# Patient Record
Sex: Female | Born: 1960 | Race: Asian | Hispanic: Yes | Marital: Married | Smoking: Never smoker
Health system: Southern US, Community
[De-identification: ages and names within clinical notes are randomized; demographics above are authoritative.]

## PROBLEM LIST (undated history)

## (undated) DIAGNOSIS — E119 Type 2 diabetes mellitus without complications: Secondary | ICD-10-CM

## (undated) HISTORY — DX: Type 2 diabetes mellitus without complications: E11.9

---

## 2000-11-22 ENCOUNTER — Encounter: Payer: Self-pay | Admitting: Emergency Medicine

## 2000-11-22 ENCOUNTER — Emergency Department (HOSPITAL_COMMUNITY): Admission: EM | Admit: 2000-11-22 | Discharge: 2000-11-23 | Payer: Self-pay | Admitting: Emergency Medicine

## 2012-05-20 ENCOUNTER — Ambulatory Visit: Payer: Self-pay

## 2013-05-24 ENCOUNTER — Ambulatory Visit: Payer: Self-pay | Admitting: *Deleted

## 2014-05-25 ENCOUNTER — Ambulatory Visit: Payer: Self-pay | Admitting: Family

## 2015-11-17 ENCOUNTER — Ambulatory Visit (INDEPENDENT_AMBULATORY_CARE_PROVIDER_SITE_OTHER): Payer: PRIVATE HEALTH INSURANCE | Admitting: Physician Assistant

## 2015-11-17 VITALS — BP 106/60 | HR 61 | Temp 97.4°F | Resp 17 | Ht 60.0 in | Wt 141.0 lb

## 2015-11-17 DIAGNOSIS — R739 Hyperglycemia, unspecified: Secondary | ICD-10-CM

## 2015-11-17 DIAGNOSIS — E1165 Type 2 diabetes mellitus with hyperglycemia: Secondary | ICD-10-CM

## 2015-11-17 LAB — COMPLETE METABOLIC PANEL WITH GFR
ALT: 35 U/L — ABNORMAL HIGH (ref 6–29)
AST: 29 U/L (ref 10–35)
Albumin: 4.6 g/dL (ref 3.6–5.1)
Alkaline Phosphatase: 63 U/L (ref 33–130)
BUN: 10 mg/dL (ref 7–25)
CO2: 24 mmol/L (ref 20–31)
Calcium: 9.7 mg/dL (ref 8.6–10.4)
Chloride: 104 mmol/L (ref 98–110)
Creat: 0.63 mg/dL (ref 0.50–1.05)
GFR, Est African American: >89 mL/min (ref >=60)
GFR, Est Non African American: >89 mL/min (ref >=60)
Glucose, Bld: 143 mg/dL — ABNORMAL HIGH (ref 65–99)
Potassium: 4.3 mmol/L (ref 3.5–5.3)
Sodium: 140 mmol/L (ref 135–146)
Total Bilirubin: 0.7 mg/dL (ref 0.2–1.2)
Total Protein: 8.4 g/dL — ABNORMAL HIGH (ref 6.1–8.1)

## 2015-11-17 LAB — POCT GLYCOSYLATED HEMOGLOBIN (HGB A1C): Hemoglobin A1C: 9.8

## 2015-11-17 LAB — MICROALBUMIN, URINE: Microalb, Ur: 1.7 mg/dL

## 2015-11-17 LAB — GLUCOSE, POCT (MANUAL RESULT ENTRY): POC Glucose: 148 mg/dl — AB (ref 70–99)

## 2015-11-17 MED ORDER — BLOOD GLUCOSE MONITOR KIT: PACK | Status: AC

## 2015-11-17 MED ORDER — METFORMIN HCL 500 MG PO TABS: 500.0000 mg | ORAL_TABLET | Freq: Two times a day (BID) | ORAL | Status: DC

## 2015-11-17 NOTE — Progress Notes (Signed)
[facility] [address], [place] [address] [phone]  Date:  11/17/2015   Name:  Miranda Sweeney   DOB:  1961-01-08   MRN:  [id number]  PCP:  No primary care provider on file.    History of Present Illness:  Miranda Sweeney is a 55 y.o. female patient who presents to [facility] for elevated glucose.  She was at her workplace when her glucose was checked. He was found to be elevated. She was encouraged to go to a medical facility for evaluation of this. She states that she has periods of fatigue, tremulousness, and urinary frequency. She will have blurry vision very rarely. She denies chest pain, palpitations, or shortness of breath. She relates vegetables and meats, but there is a lot.  No numbness or tingling.   There are no active problems to display for this patient.   No past medical history on file.  No past surgical history on file.  Social History  Substance Use Topics  . Smoking status: Never Smoker   . Smokeless tobacco: None  . Alcohol Use: None    No family history on file.  Allergies  Allergen Reactions  . Aspirin Itching    Medication list has been reviewed and updated.  No current outpatient prescriptions on file prior to visit.   No current facility-administered medications on file prior to visit.    ROS ROS otherwise unremarkable unless listed above.   Physical Examination: BP 106/60 mmHg  Pulse 61  Temp(Src) 97.4 F (36.3 C) (Oral)  Resp 17  Ht 5' (1.524 m)  Wt 141 lb (63.957 kg)  BMI 27.54 kg/m2  SpO2 98% Ideal Body Weight: Weight in (lb) to have BMI = 25: 127.7  Physical Exam  Constitutional: She is oriented to person, place, and time. She appears well-developed and well-nourished. No distress.  HENT:  Head: Normocephalic and atraumatic.  Right Ear: External ear normal.  Left Ear: External ear normal.  Eyes: Conjunctivae and EOM are normal. Pupils are equal, round, and reactive to light.  Cardiovascular: Normal rate.    Pulmonary/Chest: Effort normal. No respiratory distress.  Feet:  Right Foot:  Protective Sensation: 6 sites tested.6 sites sensed. Skin Integrity: Negative for ulcer or skin breakdown.  Left Foot:  Protective Sensation: 6 sites tested. 6 sites sensed. Skin Integrity: Negative for ulcer or skin breakdown.  Neurological: She is alert and oriented to person, place, and time.  Skin: She is not diaphoretic.  Psychiatric: She has a normal mood and affect. Her behavior is normal.   Results for orders placed or performed in visit on 11/17/15  Microalbumin, urine  Result Value Ref Range   Microalb, Ur 1.7 Not estab mg/dL  COMPLETE METABOLIC PANEL WITH GFR  Result Value Ref Range   Sodium 140 135 - 146 mmol/L   Potassium 4.3 3.5 - 5.3 mmol/L   Chloride 104 98 - 110 mmol/L   CO2 24 20 - 31 mmol/L   Glucose, Bld 143 (H) 65 - 99 mg/dL   BUN 10 7 - 25 mg/dL   Creat 4.71 2.52 - 7.12 mg/dL   Total Bilirubin 0.7 0.2 - 1.2 mg/dL   Alkaline Phosphatase 63 33 - 130 U/L   AST 29 10 - 35 U/L   ALT 35 (H) 6 - 29 U/L   Total Protein 8.4 (H) 6.1 - 8.1 g/dL   Albumin 4.6 3.6 - 5.1 g/dL   Calcium 9.7 8.6 - 92.9 mg/dL   GFR, Est African  American >89 >=60 mL/min   GFR, Est Non African American >89 >=60 mL/min  POCT glucose (manual entry)  Result Value Ref Range   POC Glucose 148 (A) 70 - 99 mg/dl  POCT glycosylated hemoglobin (Hb A1C)  Result Value Ref Range   Hemoglobin A1C 9.8      Assessment and Plan: Miranda Sweeney is a 55 y.o. female who is here today for cc of elevated blood glucose.    advised diabetic diet. Glucose monitoring advised.  Start with metformin twice per day.  Recheck in 3 weeks.  rtc if alarming sxs occur. Type 2 diabetes mellitus with hyperglycemia, without long-term current use of insulin ([place]) - Plan: metFORMIN (GLUCOPHAGE) 500 MG tablet, blood glucose meter kit and supplies KIT  Blood glucose elevated - Plan: POCT glucose (manual entry), POCT glycosylated hemoglobin (Hb  A1C), Microalbumin, urine, COMPLETE METABOLIC PANEL WITH GFR, metFORMIN (GLUCOPHAGE) 500 MG tablet    Ivar Drape, PA-C [facility] 11/17/2015 11:00 AM

## 2015-11-17 NOTE — Patient Instructions (Addendum)
IF you received an x-ray today, you will receive an invoice from [facility]. Please contact [facility] at [phone] with questions or concerns regarding your invoice.   IF you received labwork today, you will receive an invoice from [employer]. Please contact [facility] at [phone] with questions or concerns regarding your invoice.   Our billing staff will not be able to assist you with questions regarding bills from these companies.  You will be contacted with the lab results as soon as they are available. The fastest way to get your results is to activate your My Chart account. Instructions are located on the last page of this paperwork. If you have not heard from [place] regarding the results in 2 weeks, please contact this office.     We recommend that you schedule a mammogram for breast cancer screening. Typically, you do not need a referral to do this. Please contact a local imaging center to schedule your mammogram.  [facility] - [phone]  *ask for the Radiology Department [facility]) - [phone] or [phone]  [facility] - [phone] [facility] - [phone] [facility] - [phone]  *ask for the Radiology Department [facility] - [phone]  *ask for the Radiology Department [facility] - [phone]  *ask for the Mammography Department [facility] - [phone]  Please schedule a visit to the eye doctor.  Please alert them that you have diabetes. I would like you to continue to monitor your food.  Avoid the rices, breads and pastas.  Eating lean meats and fresh vegetables (more green) daily.   Please return in 3 months for recheck.  We will recheck your glucose at that time as well as your a1c.   Try to exercise with 30 minutes of aerobic (heart pumping ) activity 4 times per week.  Type 2  Diabetes Mellitus, Adult Type 2 diabetes mellitus, often simply referred to as type 2 diabetes, is a long-lasting (chronic) disease. In type 2 diabetes, the pancreas does not make enough insulin (a hormone), the cells are less responsive to the insulin that is made (insulin resistance), or both. Normally, insulin moves sugars from food into the tissue cells. The tissue cells use the sugars for energy. The lack of insulin or the lack of normal response to insulin causes excess sugars to build up in the blood instead of going into the tissue cells. As a result, high blood sugar (hyperglycemia) develops. The effect of high sugar (glucose) levels can cause many complications. Type 2 diabetes was also previously called adult-onset diabetes, but it can occur at any age.  RISK FACTORS  A person is predisposed to developing type 2 diabetes if someone in the family has the disease and also has one or more of the following primary risk factors:  Weight gain, or being overweight or obese.  An inactive lifestyle.  A history of consistently eating high-calorie foods. Maintaining a normal weight and regular physical activity can reduce the chance of developing type 2 diabetes. SYMPTOMS  A person with type 2 diabetes may not show symptoms initially. The symptoms of type 2 diabetes appear slowly. The symptoms include:  Increased thirst (polydipsia).  Increased urination (polyuria).  Increased urination during the night (nocturia).  Sudden or unexplained weight changes.  Frequent, recurring infections.  Tiredness (fatigue).  Weakness.  Vision changes, such as blurred vision.  Fruity smell to your  breath.  Abdominal pain.  Nausea or vomiting.  Cuts or bruises which are slow to heal.  Tingling or numbness in the hands or feet.  An open skin wound (ulcer). DIAGNOSIS Type 2 diabetes is frequently not diagnosed until complications of diabetes are present. Type 2 diabetes is diagnosed when  symptoms or complications are present and when blood glucose levels are increased. Your blood glucose level may be checked by one or more of the following blood tests:  A fasting blood glucose test. You will not be allowed to eat for at least 8 hours before a blood sample is taken.  A random blood glucose test. Your blood glucose is checked at any time of the day regardless of when you ate.  A hemoglobin A1c blood glucose test. A hemoglobin A1c test provides information about blood glucose control over the previous 3 months.  An oral glucose tolerance test (OGTT). Your blood glucose is measured after you have not eaten (fasted) for 2 hours and then after you drink a glucose-containing beverage. TREATMENT   You may need to take insulin or diabetes medicine daily to keep blood glucose levels in the desired range.  If you use insulin, you may need to adjust the dosage depending on the carbohydrates that you eat with each meal or snack.  Lifestyle changes are recommended as part of your treatment. These may include:  Following an individualized diet plan developed by a nutritionist or dietitian.  Exercising daily. Your health care providers will set individualized treatment goals for you based on your age, your medicines, how long you have had diabetes, and any other medical conditions you have. Generally, the goal of treatment is to maintain the following blood glucose levels:  Before meals (preprandial): 80-130 mg/dL.  After meals (postprandial): below 180 mg/dL.  A1c: less than 6.5-7%. HOME CARE INSTRUCTIONS   Have your hemoglobin A1c level checked twice a year.  Perform daily blood glucose monitoring as directed by your health care provider.  Monitor urine ketones when you are ill and as directed by your health care provider.  Take your diabetes medicine or insulin as directed by your health care provider to maintain your blood glucose levels in the desired range.  Never run out  of diabetes medicine or insulin. It is needed every day.  If you are using insulin, you may need to adjust the amount of insulin given based on your intake of carbohydrates. Carbohydrates can raise blood glucose levels but need to be included in your diet. Carbohydrates provide vitamins, minerals, and fiber which are an essential part of a healthy diet. Carbohydrates are found in fruits, vegetables, whole grains, dairy products, legumes, and foods containing added sugars.  Eat healthy foods. You should make an appointment to see a registered dietitian to help you create an eating plan that is right for you.  Lose weight if you are overweight.  Carry a medical alert card or wear your medical alert jewelry.  Carry a 15-gram carbohydrate snack with you at all times to treat low blood glucose (hypoglycemia). Some examples of 15-gram carbohydrate snacks include:  Glucose tablets, 3 or 4.  Glucose gel, 15-gram tube.  Raisins, 2 tablespoons (24 grams).  Jelly beans, 6.  Animal crackers, 8.  Regular pop, 4 ounces (120 mL).  Gummy treats, 9.  Recognize hypoglycemia. Hypoglycemia occurs with blood glucose levels of 70 mg/dL and below. The risk for hypoglycemia increases when fasting or skipping meals, during or after intense exercise, and during sleep. Hypoglycemia  symptoms can include:  Tremors or shakes.  Decreased ability to concentrate.  Sweating.  Increased heart rate.  Headache.  Dry mouth.  Hunger.  Irritability.  Anxiety.  Restless sleep.  Altered speech or coordination.  Confusion.  Treat hypoglycemia promptly. If you are alert and able to safely swallow, follow the 15:15 rule:  Take 15-20 grams of rapid-acting glucose or carbohydrate. Rapid-acting options include glucose gel, glucose tablets, or 4 ounces (120 mL) of fruit juice, regular soda, or low-fat milk.  Check your blood glucose level 15 minutes after taking the glucose.  Take 15-20 grams more of  glucose if the repeat blood glucose level is still 70 mg/dL or below.  Eat a meal or snack within 1 hour once blood glucose levels return to normal.  Be alert to feeling very thirsty and urinating more frequently than usual, which are early signs of hyperglycemia. An early awareness of hyperglycemia allows for prompt treatment. Treat hyperglycemia as directed by your health care provider.  Engage in at least 150 minutes of moderate-intensity physical activity a week, spread over at least 3 days of the week or as directed by your health care provider. In addition, you should engage in resistance exercise at least 2 times a week or as directed by your health care provider. Try to spend no more than 90 minutes at one time inactive.  Adjust your medicine and food intake as needed if you start a new exercise or sport.  Follow your sick-day plan anytime you are unable to eat or drink as usual.  Do not use any tobacco products including cigarettes, chewing tobacco, or electronic cigarettes. If you need help quitting, ask your health care provider.  Limit alcohol intake to no more than 1 drink per day for nonpregnant women and 2 drinks per day for men. You should drink alcohol only when you are also eating food. Talk with your health care provider whether alcohol is safe for you. Tell your health care provider if you drink alcohol several times a week.  Keep all follow-up visits as directed by your health care provider. This is important.  Schedule an eye exam soon after the diagnosis of type 2 diabetes and then annually.  Perform daily skin and foot care. Examine your skin and feet daily for cuts, bruises, redness, nail problems, bleeding, blisters, or sores. A foot exam by a health care provider should be done annually.  Brush your teeth and gums at least twice a day and floss at least once a day. Follow up with your dentist regularly.  Share your diabetes management plan with your workplace or  school.  Keep your immunizations up to date. It is recommended that you receive a flu (influenza) vaccine every year. It is also recommended that you receive a pneumonia (pneumococcal) vaccine. If you are 75 years of age or older and have never received a pneumonia vaccine, this vaccine may be given as a series of two separate shots. Ask your health care provider which additional vaccines may be recommended.  Learn to manage stress.  Obtain ongoing diabetes education and support as needed.  Participate in or seek rehabilitation as needed to maintain or improve independence and quality of life. Request a physical or occupational therapy referral if you are having foot or hand numbness, or difficulties with grooming, dressing, eating, or physical activity. SEEK MEDICAL CARE IF:   You are unable to eat food or drink fluids for more than 6 hours.  You have nausea and vomiting  for more than 6 hours.  Your blood glucose level is over 240 mg/dL.  There is a change in mental status.  You develop an additional serious illness.  You have diarrhea for more than 6 hours.  You have been sick or have had a fever for a couple of days and are not getting better.  You have pain during any physical activity.  SEEK IMMEDIATE MEDICAL CARE IF:  You have difficulty breathing.  You have moderate to large ketone levels.   This information is not intended to replace advice given to you by your health care provider. Make sure you discuss any questions you have with your health care provider.   Document Released: 06/02/2005 Document Revised: 02/21/2015 Document Reviewed: 12/30/2011 Elsevier Interactive Patient Education 2016 [employer]  Diabetes Mellitus and Food It is important for you to manage your blood sugar (glucose) level. Your blood glucose level can be greatly affected by what you eat. Eating healthier foods in the appropriate amounts throughout the day at about the same time each day will  help you control your blood glucose level. It can also help slow or prevent worsening of your diabetes mellitus. Healthy eating may even help you improve the level of your blood pressure and reach or maintain a healthy weight.  General recommendations for healthful eating and cooking habits include:  Eating meals and snacks regularly. Avoid going long periods of time without eating to lose weight.  Eating a diet that consists mainly of plant-based foods, such as fruits, vegetables, nuts, legumes, and whole grains.  Using low-heat cooking methods, such as baking, instead of high-heat cooking methods, such as deep frying. Work with your dietitian to make sure you understand how to use the Nutrition Facts information on food labels. HOW CAN FOOD AFFECT ME? Carbohydrates Carbohydrates affect your blood glucose level more than any other type of food. Your dietitian will help you determine how many carbohydrates to eat at each meal and teach you how to count carbohydrates. Counting carbohydrates is important to keep your blood glucose at a healthy level, especially if you are using insulin or taking certain medicines for diabetes mellitus. Alcohol Alcohol can cause sudden decreases in blood glucose (hypoglycemia), especially if you use insulin or take certain medicines for diabetes mellitus. Hypoglycemia can be a life-threatening condition. Symptoms of hypoglycemia (sleepiness, dizziness, and disorientation) are similar to symptoms of having too much alcohol.  If your health care provider has given you approval to drink alcohol, do so in moderation and use the following guidelines:  Women should not have more than one drink per day, and men should not have more than two drinks per day. One drink is equal to:  12 oz of beer.  5 oz of wine.  1 oz of hard liquor.  Do not drink on an empty stomach.  Keep yourself hydrated. Have water, diet soda, or unsweetened iced tea.  Regular soda, juice, and  other mixers might contain a lot of carbohydrates and should be counted. WHAT FOODS ARE NOT RECOMMENDED? As you make food choices, it is important to remember that all foods are not the same. Some foods have fewer nutrients per serving than other foods, even though they might have the same number of calories or carbohydrates. It is difficult to get your body what it needs when you eat foods with fewer nutrients. Examples of foods that you should avoid that are high in calories and carbohydrates but low in nutrients include:  Trans fats (most  processed foods list trans fats on the Nutrition Facts label).  Regular soda.  Juice.  Candy.  Sweets, such as cake, pie, doughnuts, and cookies.  Fried foods. WHAT FOODS CAN I EAT? Eat nutrient-rich foods, which will nourish your body and keep you healthy. The food you should eat also will depend on several factors, including:  The calories you need.  The medicines you take.  Your weight.  Your blood glucose level.  Your blood pressure level.  Your cholesterol level. You should eat a variety of foods, including:  Protein.  Lean cuts of meat.  Proteins low in saturated fats, such as fish, egg whites, and beans. Avoid processed meats.  Fruits and vegetables.  Fruits and vegetables that may help control blood glucose levels, such as apples, mangoes, and yams.  Dairy products.  Choose fat-free or low-fat dairy products, such as milk, yogurt, and cheese.  Grains, bread, pasta, and rice.  Choose whole grain products, such as multigrain bread, whole oats, and brown rice. These foods may help control blood pressure.  Fats.  Foods containing healthful fats, such as nuts, avocado, olive oil, canola oil, and fish. DOES EVERYONE WITH DIABETES MELLITUS HAVE THE SAME MEAL PLAN? Because every person with diabetes mellitus is different, there is not one meal plan that works for everyone. It is very important that you meet with a dietitian  who will help you create a meal plan that is just right for you.   This information is not intended to replace advice given to you by your health care provider. Make sure you discuss any questions you have with your health care provider.   Document Released: 02/27/2005 Document Revised: 06/23/2014 Document Reviewed: 04/29/2013 Elsevier Interactive Patient Education [employer]

## 2015-11-20 ENCOUNTER — Encounter: Payer: Self-pay | Admitting: Radiology

## 2015-12-20 ENCOUNTER — Other Ambulatory Visit: Payer: Self-pay

## 2015-12-20 MED ORDER — GLUCOSE BLOOD VI STRP: 1.0000 | ORAL_STRIP | Freq: Four times a day (QID) | Status: DC | PRN

## 2015-12-20 NOTE — Telephone Encounter (Signed)
request for True Metrix test strips Ordered through [facility] [place] [address].  [phone]

## 2016-01-04 ENCOUNTER — Other Ambulatory Visit: Payer: Self-pay

## 2016-01-04 MED ORDER — GLUCOSE BLOOD VI STRP: 1.0000 | ORAL_STRIP | Freq: Four times a day (QID) | Status: AC | PRN

## 2016-01-04 MED ORDER — GLUCOSE BLOOD VI STRP
ORAL_STRIP | Status: AC
Start: 2016-01-04 — End: ?

## 2016-01-10 ENCOUNTER — Telehealth: Payer: Self-pay | Admitting: Physician Assistant

## 2016-01-10 NOTE — Telephone Encounter (Signed)
Form in box.  Patient asked to return formwork for release of information to her employer...  Please fax this to where it suggest along with labwork, and note.  Please note that this was completed.

## 2016-01-11 NOTE — Telephone Encounter (Signed)
On MR counter.

## 2016-03-26 ENCOUNTER — Other Ambulatory Visit: Payer: Self-pay | Admitting: Physician Assistant

## 2016-03-26 DIAGNOSIS — Z1231 Encounter for screening mammogram for malignant neoplasm of breast: Secondary | ICD-10-CM

## 2016-03-28 ENCOUNTER — Ambulatory Visit
Admission: RE | Admit: 2016-03-28 | Discharge: 2016-03-28 | Disposition: A | Discharge status: Home / Self Care | Payer: No Typology Code available for payment source | Source: Ambulatory Visit | Attending: Physician Assistant | Admitting: Physician Assistant

## 2016-03-28 DIAGNOSIS — Z1231 Encounter for screening mammogram for malignant neoplasm of breast: Secondary | ICD-10-CM | POA: Diagnosis present

## 2016-05-19 ENCOUNTER — Ambulatory Visit (INDEPENDENT_AMBULATORY_CARE_PROVIDER_SITE_OTHER): Payer: PRIVATE HEALTH INSURANCE | Admitting: Family Medicine

## 2016-05-19 VITALS — BP 118/68 | HR 51 | Temp 97.8°F | Resp 16 | Ht 60.0 in | Wt 129.4 lb

## 2016-05-19 DIAGNOSIS — E1165 Type 2 diabetes mellitus with hyperglycemia: Secondary | ICD-10-CM | POA: Diagnosis not present

## 2016-05-19 DIAGNOSIS — R739 Hyperglycemia, unspecified: Secondary | ICD-10-CM | POA: Diagnosis not present

## 2016-05-19 DIAGNOSIS — Z23 Encounter for immunization: Secondary | ICD-10-CM | POA: Diagnosis not present

## 2016-05-19 DIAGNOSIS — Z13220 Encounter for screening for lipoid disorders: Secondary | ICD-10-CM | POA: Diagnosis not present

## 2016-05-19 LAB — POCT GLYCOSYLATED HEMOGLOBIN (HGB A1C): Hemoglobin A1C: 6.1

## 2016-05-19 MED ORDER — METFORMIN HCL 500 MG PO TABS: 500.0000 mg | ORAL_TABLET | Freq: Two times a day (BID) | ORAL | 5 refills | Status: DC

## 2016-05-19 NOTE — Patient Instructions (Addendum)
POCT glycosylated hemoglobin (Hb A1C)  Order: [id number]  Status:  Final result Visible to patient:  No (Not Released) Next appt:  None Dx:  Type 2 diabetes mellitus with hypergl...   10:16  Hemoglobin A1C 6.1     Specimen Collected: 05/19/16 10:16 Last Resulted: 05/19/16 10:16            IF you received an x-ray today, you will receive an invoice from [facility]. Please contact [facility] at [phone] with questions or concerns regarding your invoice.   IF you received labwork today, you will receive an invoice from [employer]. Please contact [facility] at [phone] with questions or concerns regarding your invoice.   Our billing staff will not be able to assist you with questions regarding bills from these companies.  You will be contacted with the lab results as soon as they are available. The fastest way to get your results is to activate your My Chart account. Instructions are located on the last page of this paperwork. If you have not heard from [place] regarding the results in 2 weeks, please contact this office.      Diabetes Mellitus and Standards of Medical Care Managing diabetes (diabetes mellitus) can be complicated. Your diabetes treatment may be managed by a team of health care providers, including:  A diet and nutrition specialist (registered dietitian).  A nurse.  A certified diabetes educator (CDE).  A diabetes specialist (endocrinologist).  An eye doctor.  A primary care provider.  A dentist. Your health care providers follow a schedule in order to help you get the best quality of care. The following schedule is a general guideline for your diabetes management plan. Your health care providers may also give you more specific instructions. HbA1c ( hemoglobin A1c) test This test provides information about blood sugar (glucose) control over the previous 2-3 months. It is used to check whether your diabetes  management plan needs to be adjusted.  If you are meeting your treatment goals, this test is done at least 2 times a year.  If you are not meeting treatment goals or if your treatment goals have changed, this test is done 4 times a year. Blood pressure test  This test is done at every routine medical visit. For most people, the goal is less than 140/90. In some cases, your goal blood pressure may be 130/80 or less. Ask your health care provider what your goal blood pressure should be. Dental and eye exams  Visit your dentist two times a year.  If you have type 1 diabetes, get an eye exam 3-5 years after you are diagnosed, and then once a year after your first exam.  If you were diagnosed with type 1 diabetes as a child, get an eye exam when you are age 60 or older and have had diabetes for 3-5 years. After the first exam, you should get an eye exam once a year.  If you have type 2 diabetes, have an eye exam as soon as you are diagnosed, and then once a year after your first exam. Foot care exam  Visual foot exams are done at every routine medical visit. The exams check for cuts, bruises, redness, blisters, sores, or other problems with the feet.  A complete foot exam is done by your health care provider once a year. This exam includes an inspection of the structure and skin of your feet, and a check of the pulses and sensation in your feet.  Type 1  diabetes: Get your first exam 3-5 years after diagnosis.  Type 2 diabetes: Get your first exam as soon as you are diagnosed.  Check your feet every day for cuts, bruises, redness, blisters, or sores. If you have any of these or other problems that are not healing, contact your health care provider. Kidney function test ( urine microalbumin)  This test is done once a year.  Type 1 diabetes: Get your first test 5 years after diagnosis.  Type 2 diabetes: Get your first test as soon as you are diagnosed.  If you have chronic kidney disease  (CKD), get a serum creatinine and estimated glomerular filtration rate (eGFR) test once a year. Lipid profile (cholesterol, HDL, LDL, triglycerides)  This test should be done when you are diagnosed with diabetes, and every 5 years after the first test. If you are on medicines to lower your cholesterol, you may need to get this test done every year.  The goal for LDL is less than 100 mg/dL (5.5 mmol/L). If you are at high risk, the goal is less than 70 mg/dL (3.9 mmol/L).  The goal for HDL is 40 mg/dL (2.2 mmol/L) for men and 50 mg/dL(2.8 mmol/L) for women. An HDL cholesterol of 60 mg/dL (3.3 mmol/L) or higher gives some protection against heart disease.  The goal for triglycerides is less than 150 mg/dL (8.3 mmol/L). Immunizations  The yearly flu (influenza) vaccine is recommended for everyone 6 months or older who has diabetes.  The pneumonia (pneumococcal) vaccine is recommended for everyone 2 years or older who has diabetes. If you are 69 or older, you may get the pneumonia vaccine as a series of two separate shots.  The hepatitis B vaccine is recommended for adults shortly after they have been diagnosed with diabetes.  The Tdap (tetanus, diphtheria, and pertussis) vaccine should be given:  According to normal childhood vaccination schedules, for children.  Every 10 years, for adults who have diabetes.  The shingles vaccine is recommended for people who have had chicken pox and are 50 years or older. Mental and emotional health  Screening for symptoms of eating disorders, anxiety, and depression is recommended at the time of diagnosis and afterward as needed. If your screening shows that you have symptoms (you have a positive screening result), you may need further evaluation and be referred to a mental health care provider. Diabetes self-management education  Education about how to manage your diabetes is recommended at diagnosis and ongoing as needed. Treatment plan  Your  treatment plan will be reviewed at every medical visit. Summary  Managing diabetes (diabetes mellitus) can be complicated. Your diabetes treatment may be managed by a team of health care providers.  Your health care providers follow a schedule in order to help you get the best quality of care.  Standards of care including having regular physical exams, blood tests, blood pressure monitoring, immunizations, screening tests, and education about how to manage your diabetes.  Your health care providers may also give you more specific instructions based on your individual health. This information is not intended to replace advice given to you by your health care provider. Make sure you discuss any questions you have with your health care provider. Document Released: 03/30/2009 Document Revised: 02/29/2016 Document Reviewed: 02/29/2016 Elsevier Interactive Patient Education  2017 [employer].

## 2016-05-19 NOTE — Progress Notes (Signed)
Chief Complaint  Patient presents with  . Follow-up    diabetes check  . Medication Refill    metformin    HPI  Diabetes Mellitus: Patient presents for follow up of diabetes. Symptoms: polyuria. Symptoms have gradually improved. Patient denies hyperglycemia, hypoglycemia , increase appetite, nausea and paresthesia of the feet.  Evaluation to date has been included: hemoglobin A1C.  Home sugars: BGs consistently in an acceptable range. Treatment to date: Started metformin which has been effective.  She will need refill of her metformin Denies side effects of the metformin  Lab Results  Component Value Date   HGBA1C 9.8 11/17/2015   Wt Readings from Last 3 Encounters:  05/19/16 129 lb 6.4 oz (58.7 kg)  11/17/15 141 lb (64 kg)    Past Medical History:  Diagnosis Date  . Diabetes mellitus without complication [facility])     Current Outpatient Prescriptions  Medication Sig Dispense Refill  . blood glucose meter kit and supplies KIT Dispense based on patient and insurance preference. Use up to four times daily as directed. (FOR ICD-9 250.00, 250.01). 1 each 0  . glucose blood test strip Use as instructed 100 each 12  . glucose blood test strip 1 each by Other route 4 (four) times daily as needed. Test glucose up to 4 (four) times per day. Dx: E11.65 400 each 3  . metFORMIN (GLUCOPHAGE) 500 MG tablet Take 1 tablet (500 mg total) by mouth 2 (two) times daily with a meal. 60 tablet 5   No current facility-administered medications for this visit.     Allergies:  Allergies  Allergen Reactions  . Aspirin Itching    No past surgical history on file.  Social History   Social History  . Marital status: Married    Spouse name: N/A  . Number of children: N/A  . Years of education: N/A   Social History Main Topics  . Smoking status: Never Smoker  . Smokeless tobacco: None  . Alcohol use None  . Drug use: Unknown  . Sexual activity: Not Asked   Other Topics Concern  . None    Social History Narrative  . None    Review of Systems  Constitutional: Positive for weight loss. Negative for chills, diaphoresis, fever and malaise/fatigue.  HENT: Negative for congestion and sinus pain.   Eyes: Negative for blurred vision and double vision.  Respiratory: Negative for cough, shortness of breath and wheezing.   Cardiovascular: Negative for chest pain and palpitations.  Gastrointestinal: Negative for abdominal pain, diarrhea, heartburn, nausea and vomiting.  Genitourinary: Positive for frequency. Negative for dysuria and urgency.  Skin: Negative for itching and rash.  Neurological: Negative for dizziness, tingling, weakness and headaches.    Objective: Vitals:   05/19/16 0918  BP: 118/68  Pulse: (!) 51  Resp: 16  Temp: 97.8 F (36.6 C)  TempSrc: Oral  SpO2: 100%  Weight: 129 lb 6.4 oz (58.7 kg)  Height: 5' (1.524 m)    Physical Exam  Constitutional: She is oriented to person, place, and time. She appears well-developed and well-nourished.  HENT:  Head: Normocephalic and atraumatic.  Eyes: Conjunctivae and EOM are normal.  Cardiovascular: Normal rate, regular rhythm and normal heart sounds.   No murmur heard. Pulmonary/Chest: Effort normal and breath sounds normal. No respiratory distress. She has no wheezes.  Neurological: She is oriented to person, place, and time.  Skin: Skin is warm. Capillary refill takes less than 2 seconds. No rash noted.  Psychiatric: She has a normal  mood and affect. Her behavior is normal. Judgment and thought content normal.    Assessment and Plan Adelynn was seen today for follow-up and medication refill.  Diagnoses and all orders for this visit:  Type 2 diabetes mellitus with hyperglycemia, without long-term current use of insulin (HCC) -     POCT glycosylated hemoglobin (Hb A1C) a1c 6.1  Patient showed improvement with weight loss and Metformin She is doing well on the metformin 500 bid but advised her to decrease to  once daily and monitor her sugars Will refill metformin today Continue ADA diet and exercise Will recheck cmp since there was a mild elevation of the AST with last blood draw  Screening, lipid Will also screen for dyslipidemia in the diabetic patient  A total of 30 minutes were spent face-to-face with the patient during this encounter and over half of that time was spent on counseling and coordination of care.   Follow up in 3 months for diabetes  Laquitha Heslin A Nolon Rod

## 2016-05-20 LAB — COMPREHENSIVE METABOLIC PANEL
A/G RATIO: 1.3 (ref 1.2–2.2)
ALT: 20 IU/L (ref 0–32)
AST: 13 IU/L (ref 0–40)
Albumin: 4.3 g/dL (ref 3.5–5.5)
Alkaline Phosphatase: 48 IU/L (ref 39–117)
BUN/Creatinine Ratio: 16 (ref 9–23)
BUN: 12 mg/dL (ref 6–24)
Bilirubin Total: 0.3 mg/dL (ref 0.0–1.2)
CALCIUM: 9.6 mg/dL (ref 8.7–10.2)
CO2: 27 mmol/L (ref 18–29)
CREATININE: 0.74 mg/dL (ref 0.57–1.00)
Chloride: 102 mmol/L (ref 96–106)
GFR, EST AFRICAN AMERICAN: 104 mL/min/{1.73_m2} (ref >59)
GFR, EST NON AFRICAN AMERICAN: 90 mL/min/{1.73_m2} (ref >59)
Globulin, Total: 3.4 g/dL (ref 1.5–4.5)
Glucose: 94 mg/dL (ref 65–99)
POTASSIUM: 4.9 mmol/L (ref 3.5–5.2)
Sodium: 141 mmol/L (ref 134–144)
TOTAL PROTEIN: 7.7 g/dL (ref 6.0–8.5)

## 2016-05-20 LAB — MICROALBUMIN, URINE

## 2016-05-20 LAB — LIPID PANEL
CHOL/HDL RATIO: 3.4 ratio (ref 0.0–4.4)
Cholesterol, Total: 182 mg/dL (ref 100–199)
HDL: 53 mg/dL (ref >39)
LDL CALC: 104 mg/dL — AB (ref 0–99)
Triglycerides: 126 mg/dL (ref 0–149)
VLDL CHOLESTEROL CAL: 25 mg/dL (ref 5–40)

## 2016-06-11 ENCOUNTER — Encounter: Payer: Self-pay | Admitting: Family Medicine

## 2016-06-27 ENCOUNTER — Ambulatory Visit (INDEPENDENT_AMBULATORY_CARE_PROVIDER_SITE_OTHER): Payer: PRIVATE HEALTH INSURANCE | Admitting: Emergency Medicine

## 2016-06-27 VITALS — BP 122/72 | HR 63 | Temp 97.7°F | Resp 17 | Ht 60.5 in | Wt 129.0 lb

## 2016-06-27 DIAGNOSIS — M7552 Bursitis of left shoulder: Secondary | ICD-10-CM | POA: Diagnosis not present

## 2016-06-27 DIAGNOSIS — M25512 Pain in left shoulder: Secondary | ICD-10-CM | POA: Diagnosis not present

## 2016-06-27 MED ORDER — PREDNISONE 20 MG PO TABS: 40.0000 mg | ORAL_TABLET | Freq: Every day | ORAL | 0 refills | Status: AC

## 2016-06-27 MED ORDER — TRAMADOL HCL 50 MG PO TABS: 50.0000 mg | ORAL_TABLET | Freq: Three times a day (TID) | ORAL | 0 refills | Status: DC | PRN

## 2016-06-27 MED ORDER — TRIAMCINOLONE ACETONIDE 40 MG/ML IJ SUSP
40.0000 mg | Freq: Once | INTRAMUSCULAR | Status: AC
  Administered 2016-06-27: 40 mg via INTRAMUSCULAR

## 2016-06-27 NOTE — Progress Notes (Signed)
Miranda Sweeney 56 y.o.   Chief Complaint  Patient presents with  . Arm Pain    left side     HISTORY OF PRESENT ILLNESS: This is a 56 y.o. female complaining of left shoulder pain with LROM x 2 days.  Arm Pain   The incident occurred 2 days ago. The incident occurred at home. The injury mechanism was repetitive motion (lifts/carries heavy objects at work). The pain is present in the left shoulder. The quality of the pain is described as aching. The pain is at a severity of 7/10. The pain is moderate. The pain has been constant since the incident. Pertinent negatives include no chest pain, muscle weakness, numbness or tingling. The symptoms are aggravated by movement and lifting. She has tried nothing for the symptoms.     Prior to Admission medications   Medication Sig Start Date End Date Taking? Authorizing Provider  blood glucose meter kit and supplies KIT Dispense based on patient and insurance preference. Use up to four times daily as directed. (FOR ICD-9 250.00, 250.01). 11/17/15  Yes Stephanie D English, PA  glucose blood test strip Use as instructed 01/04/16  Yes Stephanie D English, PA  glucose blood test strip 1 each by Other route 4 (four) times daily as needed. Test glucose up to 4 (four) times per day. Dx: E11.65 01/04/16  Yes Collie Siad English, PA  metFORMIN (GLUCOPHAGE) 500 MG tablet Take 1 tablet (500 mg total) by mouth 2 (two) times daily with a meal. 05/19/16  Yes Zoe A Creta Levin, MD  predniSONE (DELTASONE) 20 MG tablet Take 2 tablets (40 mg total) by mouth daily with breakfast. 06/27/16 07/02/16  Georgina Quint, MD  traMADol (ULTRAM) 50 MG tablet Take 1 tablet (50 mg total) by mouth every 8 (eight) hours as needed. 06/27/16   Georgina Quint, MD    Allergies  Allergen Reactions  . Aspirin Itching    Patient Active Problem List   Diagnosis Date Noted  . Acute pain of left shoulder 06/27/2016  . Acute bursitis of left shoulder 06/27/2016    Past Medical History:   Diagnosis Date  . Diabetes mellitus without complication (HCC)     No past surgical history on file.  Social History   Social History  . Marital status: Married    Spouse name: N/A  . Number of children: N/A  . Years of education: N/A   Occupational History  . Not on file.   Social History Main Topics  . Smoking status: Never Smoker  . Smokeless tobacco: Not on file  . Alcohol use Not on file  . Drug use: Unknown  . Sexual activity: Not on file   Other Topics Concern  . Not on file   Social History Narrative  . No narrative on file    Family History  Problem Relation Age of Onset  . Breast cancer Neg Hx      Review of Systems  Constitutional: Negative for chills and fever.  HENT: Negative.   Eyes: Negative.   Respiratory: Negative for cough, hemoptysis and shortness of breath.   Cardiovascular: Negative for chest pain, palpitations and leg swelling.  Gastrointestinal: Negative for abdominal pain, nausea and vomiting.  Genitourinary: Negative.   Musculoskeletal:       Left shoulder pain.  Skin: Negative for rash.  Neurological: Negative for dizziness, tingling, sensory change, focal weakness and numbness.  Endo/Heme/Allergies: Negative.   Psychiatric/Behavioral: Negative.   All other systems reviewed and are negative.  Vitals:  06/27/16 0918  BP: 122/72  Pulse: 63  Resp: 17  Temp: 97.7 F (36.5 C)     Physical Exam  Constitutional: She is oriented to person, place, and time. She appears well-developed and well-nourished.  HENT:  Head: Normocephalic and atraumatic.  Eyes: Conjunctivae and EOM are normal. Pupils are equal, round, and reactive to light.  Neck: Normal range of motion. Neck supple.  Cardiovascular: Normal rate, regular rhythm, normal heart sounds and intact distal pulses.   Pulmonary/Chest: Effort normal and breath sounds normal.  Abdominal: Soft. She exhibits no distension. There is no tenderness.  Musculoskeletal:  LUE:  +tenderness to upper humerus with significantly decreased ROM; NVI; distal WNL.  Neurological: She is alert and oriented to person, place, and time.  Skin: Skin is warm and dry. Capillary refill takes less than 2 seconds.  Psychiatric: She has a normal mood and affect. Her behavior is normal.  Vitals reviewed.    ASSESSMENT & PLAN: Zahriah was seen today for arm pain.  Diagnoses and all orders for this visit:  Acute pain of left shoulder -     triamcinolone acetonide (KENALOG-40) injection 40 mg; Inject 1 mL (40 mg total) into the muscle once.  Acute bursitis of left shoulder  Other orders -     traMADol (ULTRAM) 50 MG tablet; Take 1 tablet (50 mg total) by mouth every 8 (eight) hours as needed. -     predniSONE (DELTASONE) 20 MG tablet; Take 2 tablets (40 mg total) by mouth daily with breakfast.    Patient Instructions       IF you received an x-ray today, you will receive an invoice from [facility]. Please contact [facility] at [phone] with questions or concerns regarding your invoice.   IF you received labwork today, you will receive an invoice from [place]. Please contact [facility] at [phone] with questions or concerns regarding your invoice.   Our billing staff will not be able to assist you with questions regarding bills from these companies.  You will be contacted with the lab results as soon as they are available. The fastest way to get your results is to activate your My Chart account. Instructions are located on the last page of this paperwork. If you have not heard from [place] regarding the results in 2 weeks, please contact this office.      Shoulder Pain Many things can cause shoulder pain, including:  An injury.  Moving the arm in the same way again and again (overuse).  Joint pain (arthritis). Follow these instructions at home: Take these actions to help with your pain:  Squeeze a soft ball or a foam pad as much as you can.  This helps to prevent swelling. It also makes the arm stronger.  Take over-the-counter and prescription medicines only as told by your doctor.  If told, put ice on the area:  Put ice in a plastic bag.  Place a towel between your skin and the bag.  Leave the ice on for 20 minutes, 2-3 times per day. Stop putting on ice if it does not help with the pain.  If you were given a shoulder sling or immobilizer:  Wear it as told.  Remove it to shower or bathe.  Move your arm as little as possible.  Keep your hand moving. This helps prevent swelling. Contact a doctor if:  Your pain gets worse.  Medicine does not help your pain.  You have new pain in your arm, hand, or fingers. Get  help right away if:  Your arm, hand, or fingers:  Tingle.  Are numb.  Are swollen.  Are painful.  Turn white or blue. This information is not intended to replace advice given to you by your health care provider. Make sure you discuss any questions you have with your health care provider. Document Released: 11/19/2007 Document Revised: 01/27/2016 Document Reviewed: 09/25/2014 Elsevier Interactive Patient Education  2017 [employer] Inc[employer]      Agustina Caroli, MD [facility]

## 2016-06-27 NOTE — Patient Instructions (Addendum)
     IF you received an x-ray today, you will receive an invoice from [facility]. Please contact [facility] at [phone] with questions or concerns regarding your invoice.   IF you received labwork today, you will receive an invoice from [facility]. Please contact [facility] at [phone] with questions or concerns regarding your invoice.   Our billing staff will not be able to assist you with questions regarding bills from these companies.  You will be contacted with the lab results as soon as they are available. The fastest way to get your results is to activate your My Chart account. Instructions are located on the last page of this paperwork. If you have not heard from us regarding the results in 2 weeks, please contact this office.      Shoulder Pain Many things can cause shoulder pain, including:  An injury.  Moving the arm in the same way again and again (overuse).  Joint pain (arthritis). Follow these instructions at home: Take these actions to help with your pain:  Squeeze a soft ball or a foam pad as much as you can. This helps to prevent swelling. It also makes the arm stronger.  Take over-the-counter and prescription medicines only as told by your doctor.  If told, put ice on the area:  Put ice in a plastic bag.  Place a towel between your skin and the bag.  Leave the ice on for 20 minutes, 2-3 times per day. Stop putting on ice if it does not help with the pain.  If you were given a shoulder sling or immobilizer:  Wear it as told.  Remove it to shower or bathe.  Move your arm as little as possible.  Keep your hand moving. This helps prevent swelling. Contact a doctor if:  Your pain gets worse.  Medicine does not help your pain.  You have new pain in your arm, hand, or fingers. Get help right away if:  Your arm, hand, or fingers:  Tingle.  Are numb.  Are swollen.  Are painful.  Turn white or blue. This information is  not intended to replace advice given to you by your health care provider. Make sure you discuss any questions you have with your health care provider. Document Released: 11/19/2007 Document Revised: 01/27/2016 Document Reviewed: 09/25/2014 Elsevier Interactive Patient Education  2017 [employer]  

## 2016-08-27 ENCOUNTER — Encounter: Payer: Self-pay | Admitting: Family Medicine

## 2016-08-27 ENCOUNTER — Ambulatory Visit (INDEPENDENT_AMBULATORY_CARE_PROVIDER_SITE_OTHER): Payer: PRIVATE HEALTH INSURANCE | Admitting: Family Medicine

## 2016-08-27 VITALS — BP 140/75 | HR 58 | Temp 98.0°F | Resp 16 | Ht 60.5 in | Wt 134.6 lb

## 2016-08-27 DIAGNOSIS — E663 Overweight: Secondary | ICD-10-CM

## 2016-08-27 DIAGNOSIS — E1165 Type 2 diabetes mellitus with hyperglycemia: Secondary | ICD-10-CM | POA: Diagnosis not present

## 2016-08-27 DIAGNOSIS — G4726 Circadian rhythm sleep disorder, shift work type: Secondary | ICD-10-CM

## 2016-08-27 LAB — POCT GLYCOSYLATED HEMOGLOBIN (HGB A1C): HEMOGLOBIN A1C: 6.5

## 2016-08-27 NOTE — Patient Instructions (Addendum)
Try valerian root or melatonin Teas - Celestial Seasoning Sleepy Tea   IF you received an x-ray today, you will receive an invoice from [facility]. Please contact [facility] at [phone] with questions or concerns regarding your invoice.   IF you received labwork today, you will receive an invoice from [facility]. Please contact [facility] at [phone] with questions or concerns regarding your invoice.   Our billing staff will not be able to assist you with questions regarding bills from these companies.  You will be contacted with the lab results as soon as they are available. The fastest way to get your results is to activate your My Chart account. Instructions are located on the last page of this paperwork. If you have not heard from [place] regarding the results in 2 weeks, please contact this office.     Insomnia Insomnia is a sleep disorder that makes it difficult to fall asleep or to stay asleep. Insomnia can cause tiredness (fatigue), low energy, difficulty concentrating, mood swings, and poor performance at work or school. There are three different ways to classify insomnia:  Difficulty falling asleep.  Difficulty staying asleep.  Waking up too early in the morning. Any type of insomnia can be long-term (chronic) or short-term (acute). Both are common. Short-term insomnia usually lasts for three months or less. Chronic insomnia occurs at least three times a week for longer than three months. What are the causes? Insomnia may be caused by another condition, situation, or substance, such as:  Anxiety.  Certain medicines.  Gastroesophageal reflux disease (GERD) or other gastrointestinal conditions.  Asthma or other breathing conditions.  Restless legs syndrome, sleep apnea, or other sleep disorders.  Chronic pain.  Menopause. This may include hot flashes.  Stroke.  Abuse of alcohol, tobacco, or illegal  drugs.  Depression.  Caffeine.  Neurological disorders, such as Alzheimer disease.  An overactive thyroid (hyperthyroidism). The cause of insomnia may not be known. What increases the risk? Risk factors for insomnia include:  Gender. Women are more commonly affected than men.  Age. Insomnia is more common as you get older.  Stress. This may involve your professional or personal life.  Income. Insomnia is more common in people with lower income.  Lack of exercise.  Irregular work schedule or night shifts.  Traveling between different time zones. What are the signs or symptoms? If you have insomnia, trouble falling asleep or trouble staying asleep is the main symptom. This may lead to other symptoms, such as:  Feeling fatigued.  Feeling nervous about going to sleep.  Not feeling rested in the morning.  Having trouble concentrating.  Feeling irritable, anxious, or depressed. How is this treated? Treatment for insomnia depends on the cause. If your insomnia is caused by an underlying condition, treatment will focus on addressing the condition. Treatment may also include:  Medicines to help you sleep.  Counseling or therapy.  Lifestyle adjustments. Follow these instructions at home:  Take medicines only as directed by your health care provider.  Keep regular sleeping and waking hours. Avoid naps.  Keep a sleep diary to help you and your health care provider figure out what could be causing your insomnia. Include:  When you sleep.  When you wake up during the night.  How well you sleep.  How rested you feel the next day.  Any side effects of medicines you are taking.  What you eat and drink.  Make your bedroom a comfortable place where it is easy to fall asleep:  Put up shades or special blackout curtains to block light from outside.  Use a white noise machine to block noise.  Keep the temperature cool.  Exercise regularly as directed by your health  care provider. Avoid exercising right before bedtime.  Use relaxation techniques to manage stress. Ask your health care provider to suggest some techniques that may work well for you. These may include:  Breathing exercises.  Routines to release muscle tension.  Visualizing peaceful scenes.  Cut back on alcohol, caffeinated beverages, and cigarettes, especially close to bedtime. These can disrupt your sleep.  Do not overeat or eat spicy foods right before bedtime. This can lead to digestive discomfort that can make it hard for you to sleep.  Limit screen use before bedtime. This includes:  Watching TV.  Using your smartphone, tablet, and computer.  Stick to a routine. This can help you fall asleep faster. Try to do a quiet activity, brush your teeth, and go to bed at the same time each night.  Get out of bed if you are still awake after 15 minutes of trying to sleep. Keep the lights down, but try reading or doing a quiet activity. When you feel sleepy, go back to bed.  Make sure that you drive carefully. Avoid driving if you feel very sleepy.  Keep all follow-up appointments as directed by your health care provider. This is important. Contact a health care provider if:  You are tired throughout the day or have trouble in your daily routine due to sleepiness.  You continue to have sleep problems or your sleep problems get worse. Get help right away if:  You have serious thoughts about hurting yourself or someone else. This information is not intended to replace advice given to you by your health care provider. Make sure you discuss any questions you have with your health care provider. Document Released: 05/30/2000 Document Revised: 11/02/2015 Document Reviewed: 03/03/2014 Elsevier Interactive Patient Education  2017 [employer]

## 2016-08-27 NOTE — Progress Notes (Addendum)
Chief Complaint  Patient presents with  . Follow-up    3 month follow up on diabetes  . Diabetes    states she uses the bathroom more than normal and her toes feel numb a little    HPI  Diabetes Mellitus: Patient presents for follow up of diabetes. Symptoms: numbness in feet and hands. Symptoms have gradually improved.   Patient denies hyperglycemia, hypoglycemia , increase appetite, nausea    Evaluation to date has been included: hemoglobin A1C.  Home sugars: BGs consistently in an acceptable range. Fasting glucose 90-110. Treatment to date: Started metformin which has been effective.  She is taking metformin once daily She does not need refill of her metformin Denies side effects of the metformin She has not been exercising as much as she normally does  Lab Results  Component Value Date   HGBA1C 6.1 05/19/2016   Overweight Pt reports that she has not been exercising She reports that she has been feeling very good She reports that she is not tired like she was before Wt Readings from Last 3 Encounters:  08/27/16 134 lb 9.6 oz (61.1 kg)  06/27/16 129 lb (58.5 kg)  05/19/16 129 lb 6.4 oz (58.7 kg)  Body mass index is 25.85 kg/m.  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 Diabetes mellitus without complication [facility])     Current Outpatient Prescriptions  Medication Sig Dispense Refill  . blood glucose meter kit and supplies KIT Dispense based on patient and insurance preference. Use up to four times daily as directed. (FOR ICD-9 250.00, 250.01). 1 each 0  . glucose blood test strip Use as instructed 100 each 12  . glucose blood test strip 1 each by Other  route 4 (four) times daily as needed. Test glucose up to 4 (four) times per day. Dx: E11.65 400 each 3  . metFORMIN (GLUCOPHAGE) 500 MG tablet Take 1 tablet (500 mg total) by mouth 2 (two) times daily with a meal. 60 tablet 5   No current facility-administered medications for this visit.     Allergies:  Allergies  Allergen Reactions  . Aspirin Itching    No past surgical history on file.  Social History   Social History  . Marital status: Married    Spouse name: N/A  . Number of children: N/A  . Years of education: N/A   Social History Main Topics  . Smoking status: Never Smoker  . Smokeless tobacco: Never Used  . Alcohol use None  . Drug use: Unknown  . Sexual activity: Not Asked   Other Topics Concern  . None   Social History Narrative  . None    Review of Systems  Constitutional: Negative for chills and fever.  Respiratory: Negative for cough, shortness of breath and wheezing.   Cardiovascular: Negative for chest pain, palpitations and orthopnea.  Gastrointestinal: Negative for abdominal pain, nausea and vomiting.    Objective: Vitals:   08/27/16 0818  BP: 140/75  Pulse: (!) 58  Resp: 16  Temp: 98 F (36.7 C)  TempSrc: Oral  SpO2: 99%  Weight: 134 lb 9.6 oz (61.1 kg)  Height: 5' 0.5" (1.537 m)  Diabetic Foot Exam - Simple   Simple Foot Form Diabetic Foot exam was performed with the following findings:  Yes 08/27/2016  8:19 AM  Visual Inspection No deformities, no ulcerations, no other skin breakdown bilaterally:  Yes Sensation Testing Intact to touch and monofilament testing bilaterally:  Yes Pulse Check Posterior Tibialis and Dorsalis pulse intact bilaterally:  Yes Comments Patient states her toes on both feet feel numb a little at times.       Physical Exam  Constitutional: She is oriented to person, place, and time. She appears well-developed and well-nourished.  HENT:  Head: Normocephalic and atraumatic.  Nose: Nose normal.    Mouth/Throat: Oropharynx is clear and moist.  Eyes: Conjunctivae and EOM are normal.  Cardiovascular: Normal rate, regular rhythm, normal heart sounds and intact distal pulses.   Pulmonary/Chest: Effort normal and breath sounds normal. No respiratory distress. She has no wheezes.  Musculoskeletal: Normal range of motion. She exhibits no edema.  Neurological: She is alert and oriented to person, place, and time. No cranial nerve deficit.  Skin: Skin is warm. Capillary refill takes less than 2 seconds. No rash noted. No erythema.  Psychiatric: She has a normal mood and affect. Her behavior is normal. Judgment and thought content normal.    Assessment and Plan Renatha was seen today for follow-up and diabetes.  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

## 2016-08-28 LAB — BASIC METABOLIC PANEL
BUN / CREAT RATIO: 21 (ref 9–23)
BUN: 14 mg/dL (ref 6–24)
CHLORIDE: 101 mmol/L (ref 96–106)
CO2: 24 mmol/L (ref 18–29)
Calcium: 9.1 mg/dL (ref 8.7–10.2)
Creatinine, Ser: 0.68 mg/dL (ref 0.57–1.00)
GFR calc non Af Amer: 97 mL/min/{1.73_m2} (ref >59)
GFR, EST AFRICAN AMERICAN: 112 mL/min/{1.73_m2} (ref >59)
GLUCOSE: 168 mg/dL — AB (ref 65–99)
Potassium: 4.1 mmol/L (ref 3.5–5.2)
SODIUM: 143 mmol/L (ref 134–144)

## 2016-09-02 ENCOUNTER — Encounter: Payer: Self-pay | Admitting: Family Medicine

## 2017-03-04 ENCOUNTER — Ambulatory Visit: Payer: PRIVATE HEALTH INSURANCE | Admitting: Family Medicine

## 2017-03-04 NOTE — Progress Notes (Deleted)
  No chief complaint on file.   HPI  Past Medical History:  Diagnosis Date  . Diabetes mellitus without complication [facility])     Current Outpatient Prescriptions  Medication Sig Dispense Refill  . blood glucose meter kit and supplies KIT Dispense based on patient and insurance preference. Use up to four times daily as directed. (FOR ICD-9 250.00, 250.01). 1 each 0  . glucose blood test strip Use as instructed 100 each 12  . glucose blood test strip 1 each by Other route 4 (four) times daily as needed. Test glucose up to 4 (four) times per day. Dx: E11.65 400 each 3  . metFORMIN (GLUCOPHAGE) 500 MG tablet Take 1 tablet (500 mg total) by mouth 2 (two) times daily with a meal. 60 tablet 5   No current facility-administered medications for this visit.     Allergies:  Allergies  Allergen Reactions  . Aspirin Itching    No past surgical history on file.  Social History   Social History  . Marital status: Married    Spouse name: N/A  . Number of children: N/A  . Years of education: N/A   Social History Main Topics  . Smoking status: Never Smoker  . Smokeless tobacco: Never Used  . Alcohol use Not on file  . Drug use: Unknown  . Sexual activity: Not on file   Other Topics Concern  . Not on file   Social History Narrative  . No narrative on file    ROS  Objective: There were no vitals filed for this visit.  Physical Exam  Assessment and Plan There are no diagnoses linked to this encounter.   Takuya Lariccia P [employer]

## 2017-04-04 ENCOUNTER — Other Ambulatory Visit: Payer: Self-pay | Admitting: Family Medicine

## 2017-04-04 DIAGNOSIS — E1165 Type 2 diabetes mellitus with hyperglycemia: Secondary | ICD-10-CM

## 2017-04-04 DIAGNOSIS — R739 Hyperglycemia, unspecified: Secondary | ICD-10-CM

## 2017-04-06 NOTE — Telephone Encounter (Signed)
Refill request for metformin 500 mg #60 with no refills approved.  Will send to schedulers to schedule f/u DM appt with [facility].

## 2017-04-08 ENCOUNTER — Ambulatory Visit (INDEPENDENT_AMBULATORY_CARE_PROVIDER_SITE_OTHER): Payer: PRIVATE HEALTH INSURANCE | Admitting: Family Medicine

## 2017-04-08 ENCOUNTER — Encounter: Payer: Self-pay | Admitting: Family Medicine

## 2017-04-08 VITALS — BP 116/60 | HR 53 | Temp 98.1°F | Resp 17 | Ht 60.05 in | Wt 134.0 lb

## 2017-04-08 DIAGNOSIS — Z760 Encounter for issue of repeat prescription: Secondary | ICD-10-CM

## 2017-04-08 DIAGNOSIS — E119 Type 2 diabetes mellitus without complications: Secondary | ICD-10-CM

## 2017-04-08 DIAGNOSIS — Z1159 Encounter for screening for other viral diseases: Secondary | ICD-10-CM

## 2017-04-08 LAB — POCT GLYCOSYLATED HEMOGLOBIN (HGB A1C): Hemoglobin A1C: 6.3

## 2017-04-08 MED ORDER — METFORMIN HCL 500 MG PO TABS: 500.0000 mg | ORAL_TABLET | Freq: Every day | ORAL | 1 refills | Status: DC

## 2017-04-08 NOTE — Patient Instructions (Addendum)
Return for pap smear and physical     IF you received an x-ray today, you will receive an invoice from [facility]. Please contact [facility] at [phone] with questions or concerns regarding your invoice.   IF you received labwork today, you will receive an invoice from [facility]. Please contact [facility] at [phone] with questions or concerns regarding your invoice.   Our billing staff will not be able to assist you with questions regarding bills from these companies.  You will be contacted with the lab results as soon as they are available. The fastest way to get your results is to activate your My Chart account. Instructions are located on the last page of this paperwork. If you have not heard from us regarding the results in 2 weeks, please contact this office.     Diabetes Mellitus and Exercise Exercising regularly is important for your overall health, especially when you have diabetes (diabetes mellitus). Exercising is not only about losing weight. It has many health benefits, such as increasing muscle strength and bone density and reducing body fat and stress. This leads to improved fitness, flexibility, and endurance, all of which result in better overall health. Exercise has additional benefits for people with diabetes, including:  Reducing appetite.  Helping to lower and control blood glucose.  Lowering blood pressure.  Helping to control amounts of fatty substances (lipids) in the blood, such as cholesterol and triglycerides.  Helping the body to respond better to insulin (improving insulin sensitivity).  Reducing how much insulin the body needs.  Decreasing the risk for heart disease by: ? Lowering cholesterol and triglyceride levels. ? Increasing the levels of good cholesterol. ? Lowering blood glucose levels.  What is my activity plan? Your health care provider or certified diabetes educator can help you make a plan for the type and frequency  of exercise (activity plan) that works for you. Make sure that you:  Do at least 150 minutes of moderate-intensity or vigorous-intensity exercise each week. This could be brisk walking, biking, or water aerobics. ? Do stretching and strength exercises, such as yoga or weightlifting, at least 2 times a week. ? Spread out your activity over at least 3 days of the week.  Get some form of physical activity every day. ? Do not go more than 2 days in a row without some kind of physical activity. ? Avoid being inactive for more than 90 minutes at a time. Take frequent breaks to walk or stretch.  Choose a type of exercise or activity that you enjoy, and set realistic goals.  Start slowly, and gradually increase the intensity of your exercise over time.  What do I need to know about managing my diabetes?  Check your blood glucose before and after exercising. ? If your blood glucose is higher than 240 mg/dL (52.813.3 mmol/L) before you exercise, check your urine for ketones. If you have ketones in your urine, do not exercise until your blood glucose returns to normal.  Know the symptoms of low blood glucose (hypoglycemia) and how to treat it. Your risk for hypoglycemia increases during and after exercise. Common symptoms of hypoglycemia can include: ? Hunger. ? Anxiety. ? Sweating and feeling clammy. ? Confusion. ? Dizziness or feeling light-headed. ? Increased heart rate or palpitations. ? Blurry vision. ? Tingling or numbness around the mouth, lips, or tongue. ? Tremors or shakes. ? Irritability.  Keep a rapid-acting carbohydrate snack available before, during, and after exercise to help prevent or treat hypoglycemia.  Avoid  injecting insulin into areas of the body that are going to be exercised. For example, avoid injecting insulin into: ? The arms, when playing tennis. ? The legs, when jogging.  Keep records of your exercise habits. Doing this can help you and your health care provider  adjust your diabetes management plan as needed. Write down: ? Food that you eat before and after you exercise. ? Blood glucose levels before and after you exercise. ? The type and amount of exercise you have done. ? When your insulin is expected to peak, if you use insulin. Avoid exercising at times when your insulin is peaking.  When you start a new exercise or activity, work with your health care provider to make sure the activity is safe for you, and to adjust your insulin, medicines, or food intake as needed.  Drink plenty of water while you exercise to prevent dehydration or heat stroke. Drink enough fluid to keep your urine clear or pale yellow. This information is not intended to replace advice given to you by your health care provider. Make sure you discuss any questions you have with your health care provider. Document Released: 08/23/2003 Document Revised: 12/21/2015 Document Reviewed: 11/12/2015 Elsevier Interactive Patient Education  2018 [employer].

## 2017-04-08 NOTE — Progress Notes (Signed)
Chief Complaint  Patient presents with  . diabetes check    per pt will get flu shot at work when its offered    HPI  Diabetes Mellitus: Patient presents for follow up of diabetes. Symptoms: none. Symptoms have stabilized. Patient denies foot ulcerations, hyperglycemia, hypoglycemia , paresthesia of the feet, polydipsia and polyuria.  Evaluation to date has been included: hemoglobin A1C.  Home sugars: BGs consistently in an acceptable range. Treatment to date: Continued metformin which has been effective.  She reports that she has to eat sweets once in a while She denies hypoglycemia She denies diarrhea, upset stomach She denies polyuria, polydipsia, polyphagia She eats three times a day  Lab Results  Component Value Date   HGBA1C 6.3 04/08/2017   Wt Readings from Last 3 Encounters:  04/08/17 134 lb (60.8 kg)  08/27/16 134 lb 9.6 oz (61.1 kg)  06/27/16 129 lb (58.5 kg)    Need for vaccination against influenza She will be taking her flu shot at work She states that she did well with her last flu shot in 2017   Past Medical History:  Diagnosis Date  . Diabetes mellitus without complication [facility])     Current Outpatient Prescriptions  Medication Sig Dispense Refill  . blood glucose meter kit and supplies KIT Dispense based on patient and insurance preference. Use up to four times daily as directed. (FOR ICD-9 250.00, 250.01). 1 each 0  . glucose blood test strip Use as instructed 100 each 12  . glucose blood test strip 1 each by Other route 4 (four) times daily as needed. Test glucose up to 4 (four) times per day. Dx: E11.65 400 each 3  . metFORMIN (GLUCOPHAGE) 500 MG tablet Take 1 tablet (500 mg total) by mouth daily with breakfast. 90 tablet 1   No current facility-administered medications for this visit.     Allergies:  Allergies  Allergen Reactions  . Aspirin Itching    No past surgical history on file.  Social History   Social History  . Marital status:  Married    Spouse name: N/A  . Number of children: N/A  . Years of education: N/A   Social History Main Topics  . Smoking status: Never Smoker  . Smokeless tobacco: Never Used  . Alcohol use None  . Drug use: Unknown  . Sexual activity: Not Asked   Other Topics Concern  . None   Social History Narrative  . None    Family History  Problem Relation Age of Onset  . Breast cancer Neg Hx   . Diabetes Neg Hx   . Hypertension Neg Hx      ROS Review of Systems See HPI Constitution: No fevers or chills No malaise No diaphoresis Skin: No rash or itching Eyes: no blurry vision, no double vision GU: no dysuria or hematuria Neuro: no dizziness or headaches    Objective: Vitals:   04/08/17 0926  BP: 116/60  Pulse: (!) 53  Resp: 17  Temp: 98.1 F (36.7 C)  TempSrc: Oral  SpO2: 99%  Weight: 134 lb (60.8 kg)  Height: 5' 0.05" (1.525 m)    Physical Exam  Assessment and Plan Jesenya was seen today for diabetes check.  Diagnoses and all orders for this visit:  Well controlled type 2 diabetes mellitus ([place]) -     POCT glycosylated hemoglobin (Hb A1C) -     Comprehensive metabolic panel -     Lipid panel Advised to stop metformin if she gets hypoglycemia  Pt would like to continue metformin to help with weight loss   Encounter for medication refill -     metFORMIN (GLUCOPHAGE) 500 MG tablet; Take 1 tablet (500 mg total) by mouth daily with breakfast.  Encounter for hepatitis C screening test for low risk patient -     HCV Ab w/Rflx to Verification  Other orders -     Cancel: Flu Vaccine QUAD 36+ mos IM -     Cancel: Tdap vaccine greater than or equal to 7yo IM -     Cancel: POCT glycosylated hemoglobin (Hb A1C) -     Cancel: Ambulatory referral to Gastroenterology     [place]

## 2017-04-09 LAB — LIPID PANEL
CHOLESTEROL TOTAL: 205 mg/dL — AB (ref 100–199)
Chol/HDL Ratio: 3.5 ratio (ref 0.0–4.4)
HDL: 59 mg/dL (ref >39)
LDL Calculated: 118 mg/dL — ABNORMAL HIGH (ref 0–99)
TRIGLYCERIDES: 141 mg/dL (ref 0–149)
VLDL CHOLESTEROL CAL: 28 mg/dL (ref 5–40)

## 2017-04-09 LAB — COMPREHENSIVE METABOLIC PANEL
ALK PHOS: 52 IU/L (ref 39–117)
ALT: 17 IU/L (ref 0–32)
AST: 12 IU/L (ref 0–40)
Albumin/Globulin Ratio: 1.3 (ref 1.2–2.2)
Albumin: 4.5 g/dL (ref 3.5–5.5)
BILIRUBIN TOTAL: 0.5 mg/dL (ref 0.0–1.2)
BUN/Creatinine Ratio: 19 (ref 9–23)
BUN: 11 mg/dL (ref 6–24)
CHLORIDE: 101 mmol/L (ref 96–106)
CO2: 26 mmol/L (ref 20–29)
Calcium: 9.6 mg/dL (ref 8.7–10.2)
Creatinine, Ser: 0.59 mg/dL (ref 0.57–1.00)
GFR calc Af Amer: 117 mL/min/{1.73_m2} (ref >59)
GFR calc non Af Amer: 101 mL/min/{1.73_m2} (ref >59)
GLUCOSE: 113 mg/dL — AB (ref 65–99)
Globulin, Total: 3.5 g/dL (ref 1.5–4.5)
Potassium: 4.6 mmol/L (ref 3.5–5.2)
Sodium: 141 mmol/L (ref 134–144)
TOTAL PROTEIN: 8 g/dL (ref 6.0–8.5)

## 2017-04-09 LAB — HCV INTERPRETATION

## 2017-04-09 LAB — HCV AB W/RFLX TO VERIFICATION: HCV Ab: <0.1 s/co ratio (ref 0.0–0.9)

## 2017-04-20 ENCOUNTER — Other Ambulatory Visit: Payer: Self-pay

## 2017-04-20 ENCOUNTER — Emergency Department (HOSPITAL_COMMUNITY): Payer: No Typology Code available for payment source

## 2017-04-20 ENCOUNTER — Emergency Department (HOSPITAL_COMMUNITY)
Admission: EM | Admit: 2017-04-20 | Discharge: 2017-04-20 | Disposition: A | Discharge status: Home / Self Care | Payer: No Typology Code available for payment source | Attending: Emergency Medicine | Admitting: Emergency Medicine

## 2017-04-20 ENCOUNTER — Encounter (HOSPITAL_COMMUNITY): Payer: Self-pay | Admitting: Emergency Medicine

## 2017-04-20 DIAGNOSIS — M7061 Trochanteric bursitis, right hip: Secondary | ICD-10-CM | POA: Diagnosis not present

## 2017-04-20 DIAGNOSIS — M25551 Pain in right hip: Secondary | ICD-10-CM

## 2017-04-20 DIAGNOSIS — Z7901 Long term (current) use of anticoagulants: Secondary | ICD-10-CM | POA: Insufficient documentation

## 2017-04-20 DIAGNOSIS — E119 Type 2 diabetes mellitus without complications: Secondary | ICD-10-CM | POA: Insufficient documentation

## 2017-04-20 DIAGNOSIS — Y939 Activity, unspecified: Secondary | ICD-10-CM | POA: Diagnosis not present

## 2017-04-20 DIAGNOSIS — R52 Pain, unspecified: Secondary | ICD-10-CM

## 2017-04-20 MED ORDER — NAPROXEN 500 MG PO TABS: 500.0000 mg | ORAL_TABLET | Freq: Two times a day (BID) | ORAL | 0 refills | Status: DC

## 2017-04-20 MED ORDER — NAPROXEN 250 MG PO TABS
500.0000 mg | ORAL_TABLET | Freq: Once | ORAL | Status: AC
  Administered 2017-04-20: 500 mg via ORAL
  Filled 2017-04-20: qty 2

## 2017-04-20 NOTE — ED Triage Notes (Signed)
Rt hip pain x 2 weeks hurts to walk, no injury she states

## 2017-04-20 NOTE — ED Provider Notes (Signed)
[facility] EMERGENCY DEPARTMENT Provider Note   CSN: [id number] Arrival date & time: 04/20/17  0813     History   Chief Complaint Chief Complaint  Patient presents with  . Hip Pain    HPI  Miranda Sweeney is a 56 y.o. Female with a history of diabetes, who presents complaining of pain to her right hip for the last 2 weeks, and pain with walking.  Patient denies any inciting injury.  Localizes pain into the posterior bony prominence of the trochanter, denies radiation anywhere, no shooting pain down the right leg.  Reports pain has been worsening in the past 3 days, while she has been working on her feet, and now she is having a hard time walking on the leg.  Patient denies any pain in her right knee or ankle.  She reports she has had some low back pain in the past, but none with the symptoms.  Has not tried anything for the symptoms at home.       Past Medical History:  Diagnosis Date  . Diabetes mellitus without complication [facility])     Patient Active Problem List   Diagnosis Date Noted  . Acute pain of left shoulder 06/27/2016  . Acute bursitis of left shoulder 06/27/2016    History reviewed. No pertinent surgical history.  OB History    No data available       Home Medications    Prior to Admission medications   Medication Sig Start Date End Date Taking? Authorizing Provider  blood glucose meter kit and supplies KIT Dispense based on patient and insurance preference. Use up to four times daily as directed. (FOR ICD-9 250.00, 250.01). 11/17/15   Ivar Drape D, PA  glucose blood test strip Use as instructed 01/04/16   Ivar Drape D, PA  glucose blood test strip 1 each by Other route 4 (four) times daily as needed. Test glucose up to 4 (four) times per day. Dx: E11.65 01/04/16   Ivar Drape D, PA  metFORMIN (GLUCOPHAGE) 500 MG tablet Take 1 tablet (500 mg total) by mouth daily with breakfast. 04/08/17   Forrest Moron, MD    Family  History Family History  Problem Relation Age of Onset  . Breast cancer Neg Hx   . Diabetes Neg Hx   . Hypertension Neg Hx     Social History Social History   Tobacco Use  . Smoking status: Never Smoker  . Smokeless tobacco: Never Used  Substance Use Topics  . Alcohol use: Not on file  . Drug use: Not on file     Allergies   Aspirin   Review of Systems Review of Systems  Constitutional: Negative for chills and fever.  Genitourinary: Negative for dysuria and flank pain.  Musculoskeletal: Positive for arthralgias (R hip) and gait problem. Negative for back pain and joint swelling.  Skin: Negative for color change and rash.  Neurological: Negative for weakness and numbness.     Physical Exam Updated Vital Signs BP 119/64 (BP Location: Left Arm)   Pulse 69   Temp 99.3 F (37.4 C) (Oral)   Resp 18   Ht _0  (1.651 m)   Wt 60.8 kg (134 lb)   SpO2 100%   BMI 22.30 kg/m   Physical Exam  Constitutional: She appears well-developed and well-nourished. No distress.  HENT:  Head: Normocephalic and atraumatic.  Eyes: Right eye exhibits no discharge. Left eye exhibits no discharge.  Pulmonary/Chest: Effort normal. No respiratory distress.  Musculoskeletal:  Pain over the posterior prominence of the right greater trochanter, no surrounding redness, swelling or ecchymosis, no tenderness to palpation elsewhere on the hip, no tenderness over the L-spine or lower back, right hip range of motion is limited by pain, regularly with Abduction, patient able to bear weight but with discomfort. Right knee and right ankle nontender to palpation with normal exam.  Distal pulses are 2+ and sensation is intact, normal dorsiflexion and plantarflexion  Neurological: She is alert. Coordination normal.  Skin: She is not diaphoretic.  Psychiatric: She has a normal mood and affect. Her behavior is normal.  Nursing note and vitals reviewed.    ED Treatments / Results  Labs (all labs ordered  are listed, but only abnormal results are displayed) Labs Reviewed - No data to display  EKG  EKG Interpretation None       Radiology Dg Hip Unilat With Pelvis 2-3 Views Right  Result Date: 04/20/2017 CLINICAL DATA:  RIGHT buttock pain for 3 days, denies injury, history diabetes mellitus EXAM: DG HIP (WITH OR WITHOUT PELVIS) 2-3V RIGHT COMPARISON:  None FINDINGS: Bones appear demineralized. Hip and SI joint spaces preserved. No acute fracture, dislocation, or bone destruction. IMPRESSION: No acute abnormalities. Electronically Signed   By: Lavonia Dana M.D.   On: 04/20/2017 09:47    Procedures Procedures (including critical care time)  Medications Ordered in ED Medications  naproxen (NAPROSYN) tablet 500 mg (500 mg Oral Given 04/20/17 0950)     Initial Impression / Assessment and Plan / ED Course  I have reviewed the triage vital signs and the nursing notes.  Pertinent labs & imaging results that were available during my care of the patient were reviewed by me and considered in my medical decision making (see chart for details).   Radiology reviewed. No evidence of fracture or dislocation. Presentation suggestive of trochanteric bursitis. No concern for septic joint.  Labs from PCP 2 weeks ago reveal good renal function. Pt able to ambulate in ED with assistance of crutches without difficulty. Hemodynamically stable in NAD prior to dc. Discussed dx and tx with pt. Advised heat therapy, stretching, and NSAID use. Pt to f-u with ortho if symptoms persist.   Patient has documented aspirin allergy, naproxen given in the emergency department and patient observed with no adverse reactions.  Final Clinical Impressions(s) / ED Diagnoses   Final diagnoses:  Right hip pain  Trochanteric bursitis of right hip    ED Discharge Orders        Ordered    naproxen (NAPROSYN) 500 MG tablet  2 times daily     04/20/17 Tornado, Etsuko Dierolf N, [place] 04/20/17 Odell, [place],  DO 04/20/17 1039

## 2017-10-07 ENCOUNTER — Ambulatory Visit: Payer: No Typology Code available for payment source | Admitting: Family Medicine

## 2017-10-07 ENCOUNTER — Encounter: Payer: Self-pay | Admitting: Family Medicine

## 2017-10-07 ENCOUNTER — Other Ambulatory Visit: Payer: Self-pay

## 2017-10-07 VITALS — BP 120/62 | HR 60 | Temp 97.6°F | Resp 17 | Ht 65.0 in | Wt 136.4 lb

## 2017-10-07 DIAGNOSIS — E785 Hyperlipidemia, unspecified: Secondary | ICD-10-CM

## 2017-10-07 DIAGNOSIS — Z1211 Encounter for screening for malignant neoplasm of colon: Secondary | ICD-10-CM

## 2017-10-07 DIAGNOSIS — E118 Type 2 diabetes mellitus with unspecified complications: Secondary | ICD-10-CM | POA: Diagnosis not present

## 2017-10-07 LAB — COMPREHENSIVE METABOLIC PANEL
A/G RATIO: 1.3 (ref 1.2–2.2)
ALT: 15 IU/L (ref 0–32)
AST: 18 IU/L (ref 0–40)
Albumin: 4.4 g/dL (ref 3.5–5.5)
Alkaline Phosphatase: 50 IU/L (ref 39–117)
BUN/Creatinine Ratio: 15 (ref 9–23)
BUN: 11 mg/dL (ref 6–24)
Bilirubin Total: 0.4 mg/dL (ref 0.0–1.2)
CALCIUM: 9.3 mg/dL (ref 8.7–10.2)
CO2: 25 mmol/L (ref 20–29)
Chloride: 102 mmol/L (ref 96–106)
Creatinine, Ser: 0.71 mg/dL (ref 0.57–1.00)
GFR, EST AFRICAN AMERICAN: 109 mL/min/{1.73_m2} (ref >59)
GFR, EST NON AFRICAN AMERICAN: 94 mL/min/{1.73_m2} (ref >59)
GLOBULIN, TOTAL: 3.4 g/dL (ref 1.5–4.5)
Glucose: 115 mg/dL — ABNORMAL HIGH (ref 65–99)
POTASSIUM: 4.3 mmol/L (ref 3.5–5.2)
SODIUM: 141 mmol/L (ref 134–144)
TOTAL PROTEIN: 7.8 g/dL (ref 6.0–8.5)

## 2017-10-07 LAB — LIPID PANEL
CHOLESTEROL TOTAL: 175 mg/dL (ref 100–199)
Chol/HDL Ratio: 3.2 ratio (ref 0.0–4.4)
HDL: 54 mg/dL (ref >39)
LDL Calculated: 99 mg/dL (ref 0–99)
Triglycerides: 110 mg/dL (ref 0–149)
VLDL CHOLESTEROL CAL: 22 mg/dL (ref 5–40)

## 2017-10-07 LAB — HEMOGLOBIN A1C
Est. average glucose Bld gHb Est-mCnc: 134 mg/dL
HEMOGLOBIN A1C: 6.3 % — AB (ref 4.8–5.6)

## 2017-10-07 NOTE — Patient Instructions (Signed)
     IF you received an x-ray today, you will receive an invoice from [facility]. Please contact [facility] at [phone] with questions or concerns regarding your invoice.   IF you received labwork today, you will receive an invoice from [facility]. Please contact [facility] at [phone] with questions or concerns regarding your invoice.   Our billing staff will not be able to assist you with questions regarding bills from these companies.  You will be contacted with the lab results as soon as they are available. The fastest way to get your results is to activate your My Chart account. Instructions are located on the last page of this paperwork. If you have not heard from us regarding the results in 2 weeks, please contact this office.     

## 2017-10-07 NOTE — Progress Notes (Signed)
Chief Complaint  Patient presents with  . Diabetes    blood sugar this morning of 129 this is the highest with it ranging 114 and under.  Pt no longer taking metformin per pt.    HPI  Diabetes Mellitus: Patient presents for follow up of diabetes. Symptoms: hyperglycemia and paresthesia of the feet. Symptoms have stabilized. Patient denies hypoglycemia , nausea, polydipsia, polyuria and weight loss.  Evaluation to date has been included: hemoglobin A1C.  Home sugars: BGs consistently in an acceptable range. Treatment to date: Discontinued metformin which has been effective.   Lab Results  Component Value Date   HGBA1C 6.3 (H) 10/07/2017    Colon Cancer Screening She has never had a colonoscopy She denies blood in his stool, unexpected weight loss or pain with defecation No rectal itching she does not smoke she does not have a family history of colon cancer   Dyslipidemia: Patient presents for evaluation of lipids.  Compliance with treatment thus far has been excellent.  A repeat fasting lipid profile was done.  The patient does not use medications that may worsen dyslipidemias (corticosteroids, progestins, anabolic steroids, diuretics, beta-blockers, amiodarone, cyclosporine, olanzapine). The patient exercises intermittently.  The patient is not known to have coexisting coronary artery disease.      Past Medical History:  Diagnosis Date  . Diabetes mellitus without complication ([place])     Current Outpatient Medications  Medication Sig Dispense Refill  . blood glucose meter kit and supplies KIT Dispense based on patient and insurance preference. Use up to four times daily as directed. (FOR ICD-9 250.00, 250.01). 1 each 0  . glucose blood test strip Use as instructed 100 each 12  . glucose blood test strip 1 each by Other route 4 (four) times daily as needed. Test glucose up to 4 (four) times per day. Dx: E11.65 400 each 3  . naproxen (NAPROSYN) 500 MG tablet Take 1 tablet (500  mg total) 2 (two) times daily by mouth. 30 tablet 0   No current facility-administered medications for this visit.     Allergies:  Allergies  Allergen Reactions  . Aspirin Itching    No past surgical history on file.  Social History   Socioeconomic History  . Marital status: Married    Spouse name: Not on file  . Number of children: Not on file  . Years of education: Not on file  . Highest education level: Not on file  Occupational History  . Not on file  Social Needs  . Financial resource strain: Not on file  . Food insecurity:    Worry: Not on file    Inability: Not on file  . Transportation needs:    Medical: Not on file    Non-medical: Not on file  Tobacco Use  . Smoking status: Never Smoker  . Smokeless tobacco: Never Used  Substance and Sexual Activity  . Alcohol use: Not on file  . Drug use: Not on file  . Sexual activity: Not on file  Lifestyle  . Physical activity:    Days per week: Not on file    Minutes per session: Not on file  . Stress: Not on file  Relationships  . Social connections:    Talks on phone: Not on file    Gets together: Not on file    Attends religious service: Not on file    Active member of club or organization: Not on file    Attends meetings of clubs or organizations: Not on file  Relationship status: Not on file  Other Topics Concern  . Not on file  Social History Narrative  . Not on file    Family History  Problem Relation Age of Onset  . Breast cancer Neg Hx   . Diabetes Neg Hx   . Hypertension Neg Hx      ROS Review of Systems See HPI Constitution: No fevers or chills No malaise No diaphoresis Skin: No rash or itching Eyes: no blurry vision, no double vision GU: no dysuria or hematuria Neuro: no dizziness or headaches * all others reviewed and negative   Objective: Vitals:   10/07/17 0919 10/07/17 1042  BP: (!) 151/68 120/62  Pulse: 60   Resp: 17   Temp: 97.6 F (36.4 C)   TempSrc: Oral   SpO2:  99%   Weight: 136 lb 6.4 oz (61.9 kg)   Height: _0  (1.651 m)    BP Readings from Last 3 Encounters:  10/07/17 120/62  04/20/17 119/64  04/08/17 116/60     Physical Exam  Constitutional: She is oriented to person, place, and time. She appears well-developed and well-nourished.  HENT:  Head: Normocephalic and atraumatic.  Eyes: Conjunctivae and EOM are normal.  Neck: Normal range of motion. Neck supple.  Cardiovascular: Normal rate and regular rhythm.  No murmur heard. Pulmonary/Chest: Effort normal and breath sounds normal. No stridor. No respiratory distress. She has no wheezes.  Abdominal: Soft. Bowel sounds are normal. She exhibits no distension and no mass. There is no tenderness. There is no guarding.  Musculoskeletal: Normal range of motion. She exhibits no edema.  Neurological: She is alert and oriented to person, place, and time.  Skin: Skin is warm. Capillary refill takes less than 2 seconds.  Psychiatric: She has a normal mood and affect. Her behavior is normal. Judgment and thought content normal.     Diabetic Foot Exam - Simple   Simple Foot Form Diabetic Foot exam was performed with the following findings:  Yes 10/07/2017 10:14 AM  Visual Inspection No deformities, no ulcerations, no other skin breakdown bilaterally:  Yes Sensation Testing Intact to touch and monofilament testing bilaterally:  Yes Pulse Check Posterior Tibialis and Dorsalis pulse intact bilaterally:  Yes Comments     Assessment and Plan Tamakia was seen today for diabetes.  Diagnoses and all orders for this visit:  Type 2 diabetes mellitus with complication, unspecified whether long term insulin use ([place])- will assess diabetes today with labs Pt diet controlled -     Microalbumin, urine -     Cancel: POCT glycosylated hemoglobin (Hb A1C) -     Hemoglobin A1c -     Comprehensive metabolic panel  Dyslipidemia, goal LDL below 100- will assess control Pt compliant with lifestyle  modification -     Lipid panel -     Comprehensive metabolic panel  Screening for malignant neoplasm of colon- discussed age appropriate screenings -     Ambulatory referral to Gastroenterology  A total of 30 minutes were spent face-to-face with the patient during this encounter and over half of that time was spent on counseling and coordination of care.    [place]

## 2017-10-08 LAB — MICROALBUMIN, URINE: Microalbumin, Urine: 7.5 ug/mL

## 2017-10-14 ENCOUNTER — Ambulatory Visit: Payer: No Typology Code available for payment source | Admitting: Family Medicine

## 2017-10-14 ENCOUNTER — Encounter: Payer: Self-pay | Admitting: Family Medicine

## 2017-10-14 ENCOUNTER — Other Ambulatory Visit: Payer: Self-pay

## 2017-10-14 VITALS — BP 102/60 | HR 57 | Temp 98.2°F | Resp 17 | Ht 65.0 in | Wt 133.6 lb

## 2017-10-14 DIAGNOSIS — Z23 Encounter for immunization: Secondary | ICD-10-CM | POA: Diagnosis not present

## 2017-10-14 DIAGNOSIS — E785 Hyperlipidemia, unspecified: Secondary | ICD-10-CM

## 2017-10-14 DIAGNOSIS — E119 Type 2 diabetes mellitus without complications: Secondary | ICD-10-CM | POA: Diagnosis not present

## 2017-10-14 NOTE — Patient Instructions (Addendum)
     IF you received an x-ray today, you will receive an invoice from [facility]. Please contact [facility] at [phone] with questions or concerns regarding your invoice.   IF you received labwork today, you will receive an invoice from [facility]. Please contact [facility] at [phone] with questions or concerns regarding your invoice.   Our billing staff will not be able to assist you with questions regarding bills from these companies.  You will be contacted with the lab results as soon as they are available. The fastest way to get your results is to activate your My Chart account. Instructions are located on the last page of this paperwork. If you have not heard from us regarding the results in 2 weeks, please contact this office.    Pap Test Why am I having this test? A pap test is sometimes called a pap smear. It is a screening test that is used to check for signs of cancer of the vagina, cervix, and uterus. The test can also identify the presence of infection or precancerous changes. Your health care provider will likely recommend you have this test done on a regular basis. This test may be done:  Every 3 years, starting at age 21.  Every 5 years, in combination with testing for the presence of human papillomavirus (HPV).  More or less often depending on other medical conditions.  What kind of sample is taken? Using a small cotton swab, plastic spatula, or brush, your health care provider will collect a sample of cells from the surface of your cervix. Your cervix is the opening to your uterus, also called a womb. Secretions from the cervix and vagina may also be collected. How do I prepare for this test?  Be aware of where you are in your menstrual cycle. You may be asked to reschedule the test if you are menstruating on the day of the test.  You may need to reschedule if you have a known vaginal infection on the day of the test.  You may be asked to avoid  douching or taking a bath the day before or the day of the test.  Some medicines can cause abnormal test results, such as digitalis and tetracycline. Talk with your health care provider before your test if you take one of these medicines. What do the results mean? Abnormal test results may indicate a number of health conditions. These may include:  Cancer. Although pap test results cannot be used to diagnose cancer of the cervix, vagina, or uterus, they may suggest the possibility of cancer. Further tests would be required to determine if cancer is present.  Sexually transmitted disease.  Fungal infection.  Parasite infection.  Herpes infection.  A condition causing or contributing to infertility.  It is your responsibility to obtain your test results. Ask the lab or department performing the test when and how you will get your results. Contact your health care provider to discuss any questions you have about your results. Talk with your health care provider to discuss your results, treatment options, and if necessary, the need for more tests. Talk with your health care provider if you have any questions about your results. This information is not intended to replace advice given to you by your health care provider. Make sure you discuss any questions you have with your health care provider. Document Released: 08/23/2002 Document Revised: 02/06/2016 Document Reviewed: 10/24/2013 Elsevier Interactive Patient Education  2018 Elsevier Inc.  

## 2017-10-14 NOTE — Progress Notes (Signed)
Chief Complaint  Patient presents with  . blood work results    pt says she is here for bloodwork results, 3 mos f/u ha and bp    HPI   Well controlled diabetes Pt is here for follow up of her labs for diabetes She is diet controlled She gets fasting glucose between 100-120 She denies hypoglycemia, hyperglycemia, paresthesia, nausea or vomiting Lab Results  Component Value Date   HGBA1C 6.3 (H) 10/07/2017    Dyslipidemia: Patient presents for evaluation of lipids.  Compliance with treatment thus far has been excellent.  A repeat fasting lipid profile was done.  The patient does not use medications that may worsen dyslipidemias (corticosteroids, progestins, anabolic steroids, diuretics, beta-blockers, amiodarone, cyclosporine, olanzapine). The patient exercises daily.  The patient is not known to have coexisting coronary artery disease.   The 10-year ASCVD risk score Mikey Bussing DC Brooke Bonito., et al., 2013) is: 3%   Values used to calculate the score:     Age: 57 years     Sex: Female     Is Non-Hispanic African American: No     Diabetic: Yes     Tobacco smoker: No     Systolic Blood Pressure: 315 mmHg     Is BP treated: No     HDL Cholesterol: 54 mg/dL     Total Cholesterol: 175 mg/dL  Lab Results  Component Value Date   CHOL 175 10/07/2017   HDL 54 10/07/2017   LDLCALC 99 10/07/2017   TRIG 110 10/07/2017   CHOLHDL 3.2 10/07/2017      Past Medical History:  Diagnosis Date  . Diabetes mellitus without complication ([place])     Current Outpatient Medications  Medication Sig Dispense Refill  . blood glucose meter kit and supplies KIT Dispense based on patient and insurance preference. Use up to four times daily as directed. (FOR ICD-9 250.00, 250.01). 1 each 0  . glucose blood test strip Use as instructed 100 each 12  . naproxen (NAPROSYN) 500 MG tablet Take 1 tablet (500 mg total) 2 (two) times daily by mouth. 30 tablet 0  . glucose blood test strip 1 each by Other route 4  (four) times daily as needed. Test glucose up to 4 (four) times per day. Dx: E11.65 400 each 3   No current facility-administered medications for this visit.     Allergies:  Allergies  Allergen Reactions  . Aspirin Itching    History reviewed. No pertinent surgical history.  Social History   Socioeconomic History  . Marital status: Married    Spouse name: Not on file  . Number of children: Not on file  . Years of education: Not on file  . Highest education level: Not on file  Occupational History  . Not on file  Social Needs  . Financial resource strain: Not on file  . Food insecurity:    Worry: Not on file    Inability: Not on file  . Transportation needs:    Medical: Not on file    Non-medical: Not on file  Tobacco Use  . Smoking status: Never Smoker  . Smokeless tobacco: Never Used  Substance and Sexual Activity  . Alcohol use: Not on file  . Drug use: Not on file  . Sexual activity: Not on file  Lifestyle  . Physical activity:    Days per week: Not on file    Minutes per session: Not on file  . Stress: Not on file  Relationships  . Social connections:  Talks on phone: Not on file    Gets together: Not on file    Attends religious service: Not on file    Active member of club or organization: Not on file    Attends meetings of clubs or organizations: Not on file    Relationship status: Not on file  Other Topics Concern  . Not on file  Social History Narrative  . Not on file    Family History  Problem Relation Age of Onset  . Breast cancer Neg Hx   . Diabetes Neg Hx   . Hypertension Neg Hx      ROS Review of Systems See HPI Constitution: No fevers or chills No malaise No diaphoresis Skin: No rash or itching Eyes: no blurry vision, no double vision GU: no dysuria or hematuria Neuro: no dizziness or headaches  all others reviewed and negative   Objective: Vitals:   10/14/17 1005  BP: 102/60  Pulse: (!) 57  Resp: 17  Temp: 98.2 F  (36.8 C)  TempSrc: Oral  SpO2: 98%  Weight: 133 lb 9.6 oz (60.6 kg)  Height: '5\' 5"'$  (1.651 m)    Physical Exam  Constitutional: She is oriented to person, place, and time. She appears well-developed and well-nourished.  HENT:  Head: Normocephalic and atraumatic.  Eyes: Conjunctivae and EOM are normal.  Cardiovascular: Normal rate, regular rhythm and normal heart sounds.  No murmur heard. Pulmonary/Chest: Effort normal and breath sounds normal. No stridor. No respiratory distress.  Neurological: She is alert and oriented to person, place, and time.  Skin: Skin is warm. Capillary refill takes less than 2 seconds.  Psychiatric: She has a normal mood and affect. Her behavior is normal. Judgment and thought content normal.     Assessment and Plan Anab was seen today for blood work results.  Diagnoses and all orders for this visit:  Well controlled type 2 diabetes mellitus ([place])- continue diet controlled Discussed goal for a1c <7%  Need for prophylactic vaccination with combined diphtheria-tetanus-pertussis (DTP) vaccine -     Tdap vaccine greater than or equal to 7yo IM  Dyslipidemia, goal LDL below 100-  Discussed her lipid levels which were at goal     [place]

## 2017-10-15 ENCOUNTER — Ambulatory Visit: Payer: Self-pay | Admitting: Family Medicine

## 2017-11-17 ENCOUNTER — Encounter: Payer: Self-pay | Admitting: Family Medicine

## 2018-04-01 ENCOUNTER — Other Ambulatory Visit: Payer: Self-pay | Admitting: Physician Assistant

## 2018-04-01 DIAGNOSIS — Z1231 Encounter for screening mammogram for malignant neoplasm of breast: Secondary | ICD-10-CM

## 2018-04-06 ENCOUNTER — Ambulatory Visit
Admission: RE | Admit: 2018-04-06 | Discharge: 2018-04-06 | Disposition: A | Discharge status: Home / Self Care | Payer: No Typology Code available for payment source | Source: Ambulatory Visit | Attending: Physician Assistant | Admitting: Physician Assistant

## 2018-04-06 DIAGNOSIS — Z1231 Encounter for screening mammogram for malignant neoplasm of breast: Secondary | ICD-10-CM | POA: Diagnosis present

## 2018-10-26 IMAGING — DX DG HIP (WITH OR WITHOUT PELVIS) 2-3V*R*
3 series · 3 of 3 positions shown · non-contrast
Comparison: None

CLINICAL DATA: RIGHT buttock pain for 3 days, denies injury,
history diabetes mellitus

EXAM:
DG HIP (WITH OR WITHOUT PELVIS) 2-3V RIGHT

[pelvis ap]
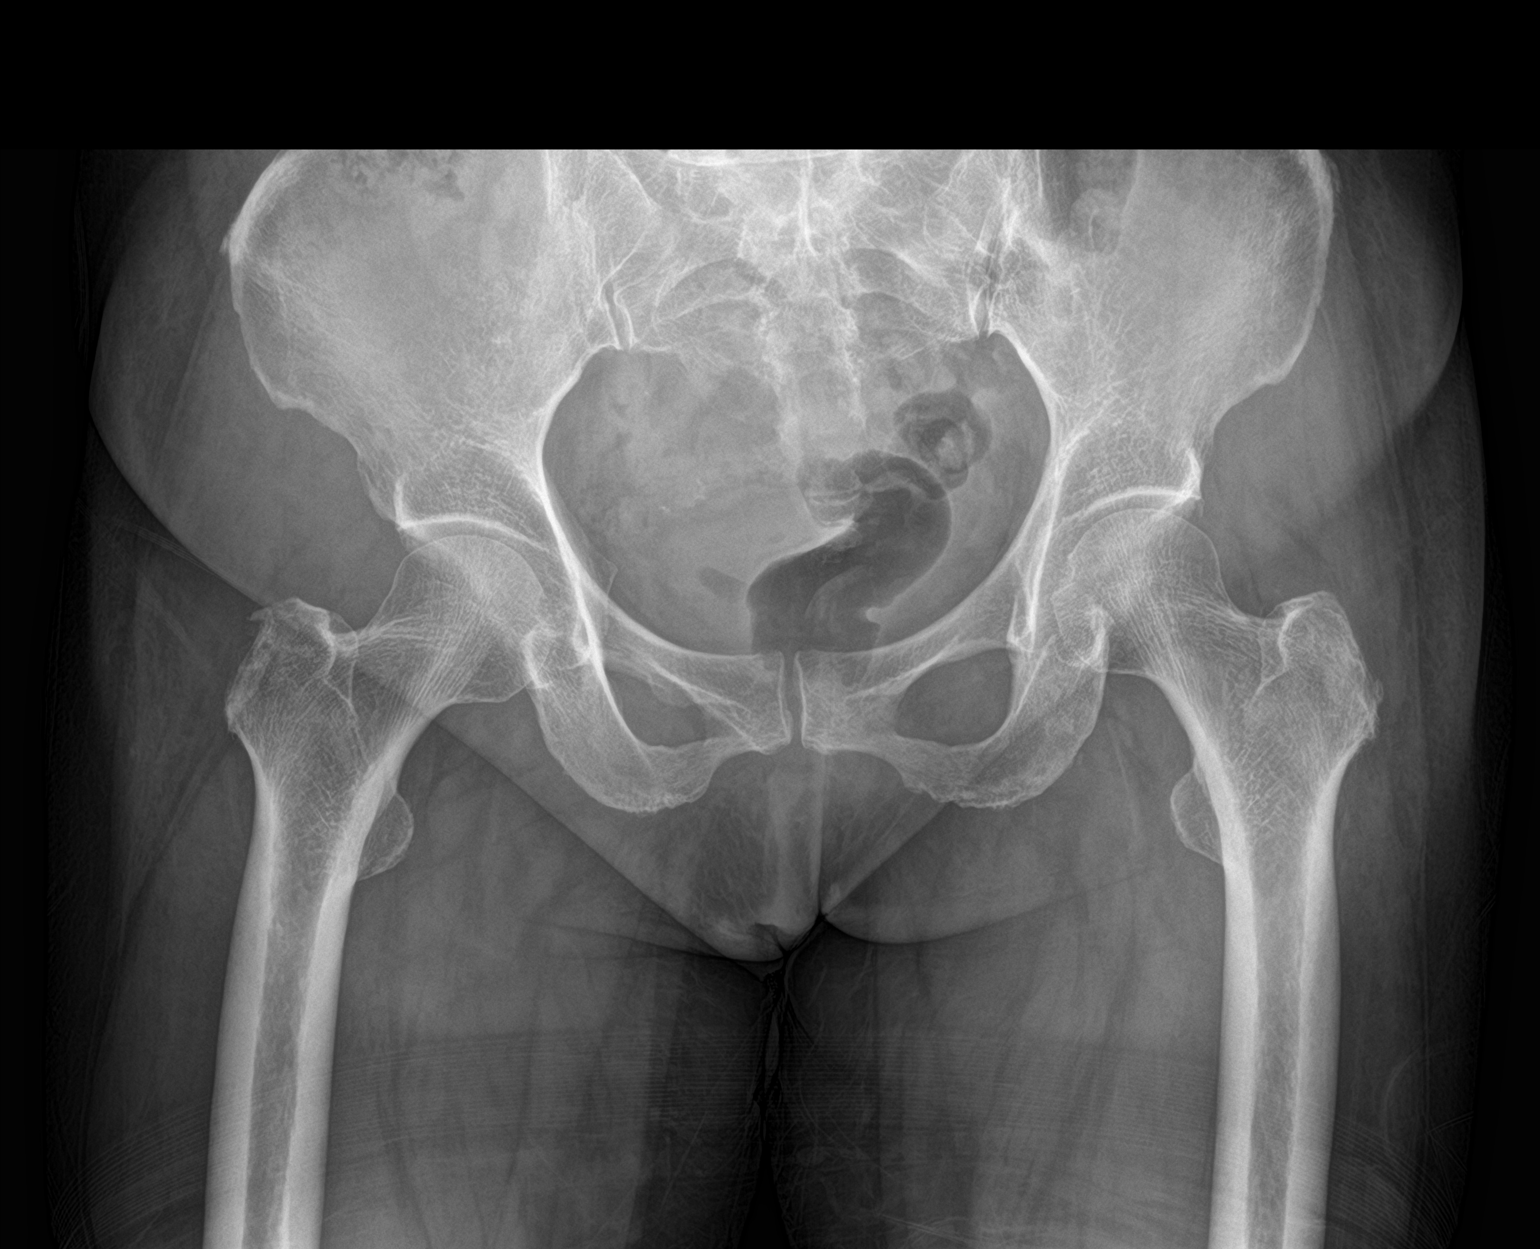

[hip ap]
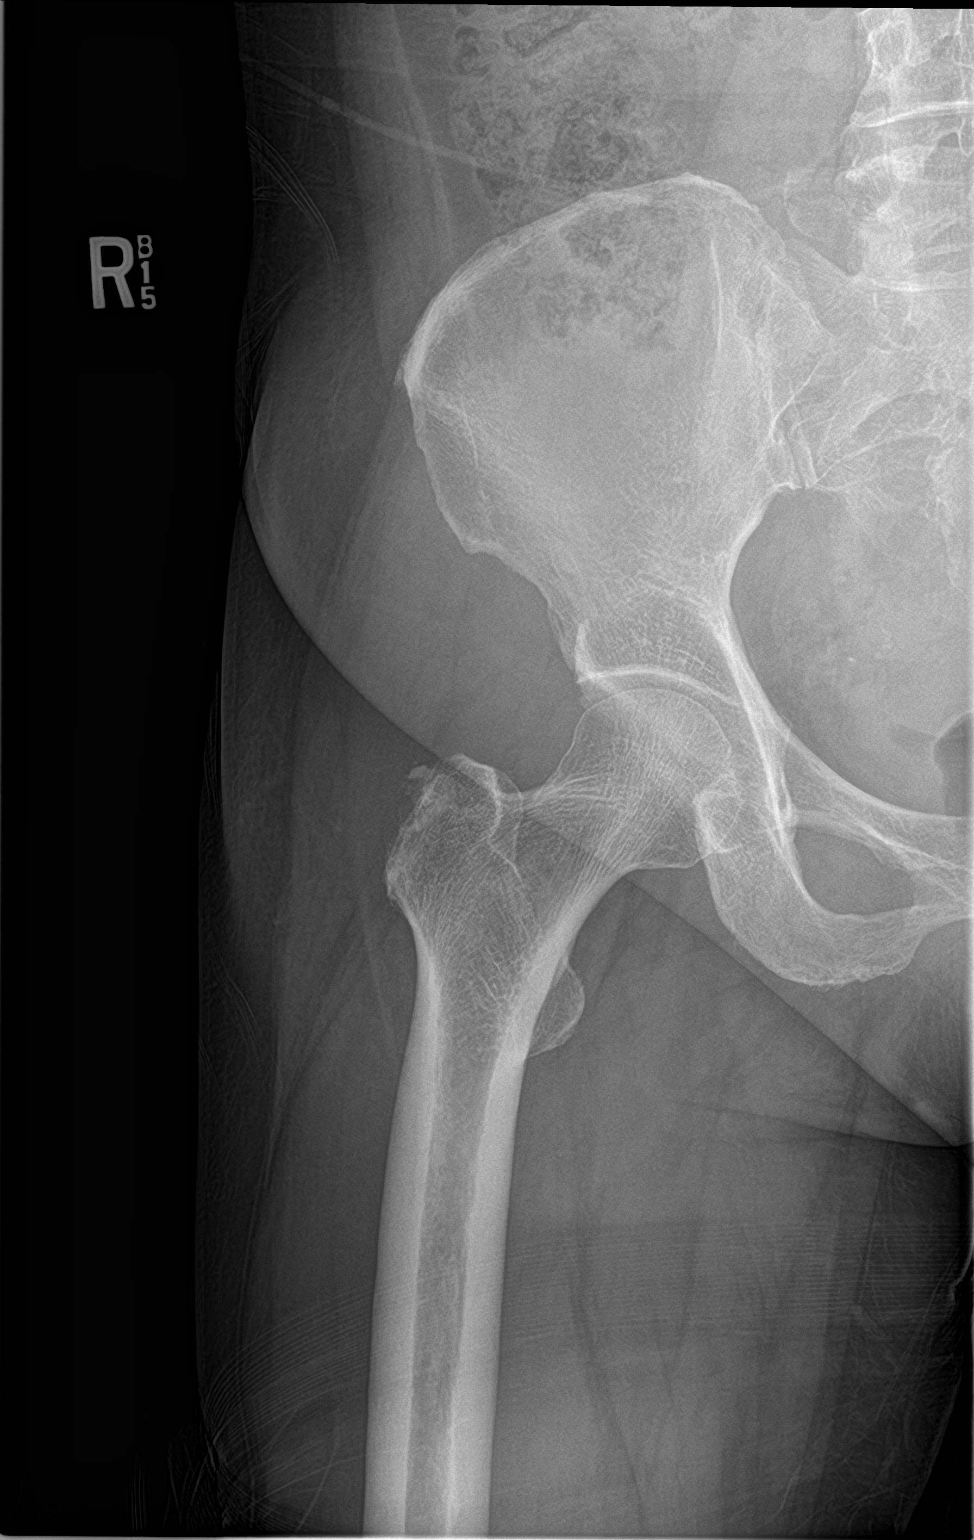

[hip lat]
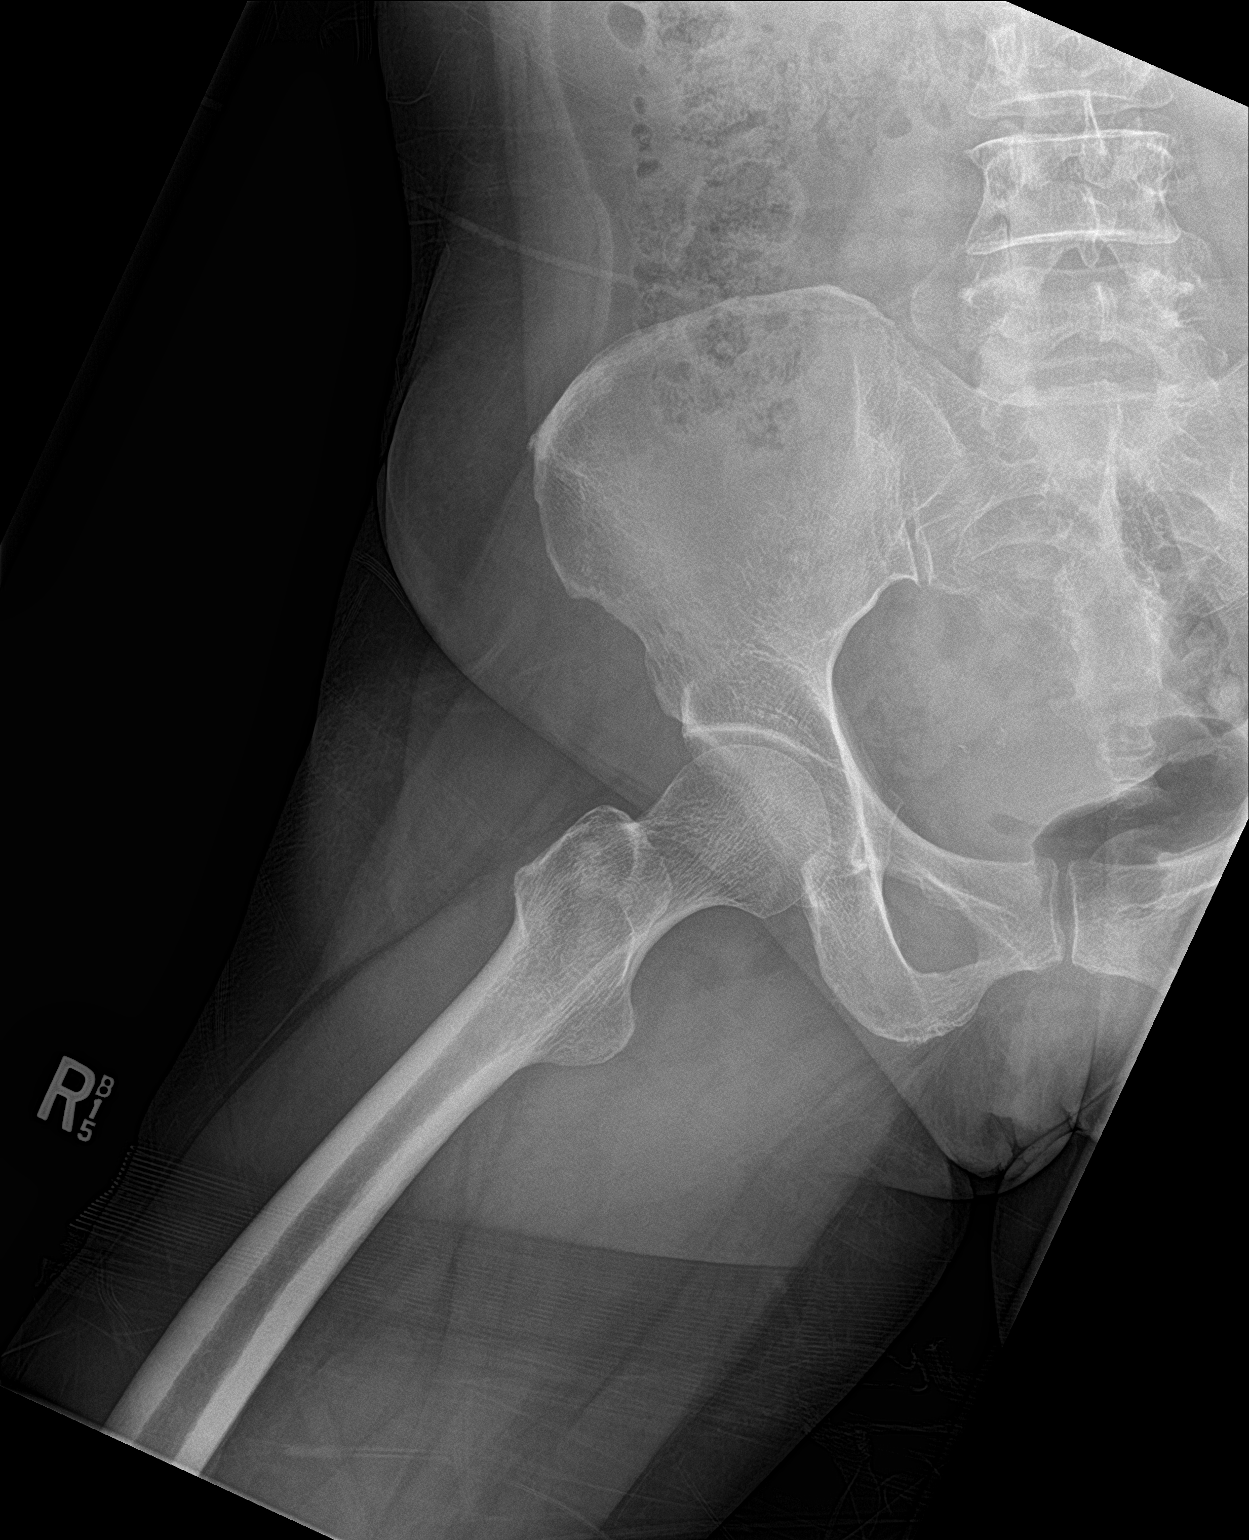

[3 of 3 positions shown; findings below may reference images not displayed]

FINDINGS: Bones appear demineralized.

Hip and SI joint spaces preserved.

No acute fracture, dislocation, or bone destruction.
IMPRESSION: No acute abnormalities.

## 2019-03-14 ENCOUNTER — Other Ambulatory Visit: Payer: Self-pay | Admitting: Physician Assistant

## 2019-03-14 DIAGNOSIS — Z1231 Encounter for screening mammogram for malignant neoplasm of breast: Secondary | ICD-10-CM

## 2019-03-22 ENCOUNTER — Ambulatory Visit
Admission: RE | Admit: 2019-03-22 | Discharge: 2019-03-22 | Disposition: A | Discharge status: Home / Self Care | Payer: PRIVATE HEALTH INSURANCE | Source: Ambulatory Visit | Attending: Physician Assistant | Admitting: Physician Assistant

## 2019-03-22 DIAGNOSIS — Z1231 Encounter for screening mammogram for malignant neoplasm of breast: Secondary | ICD-10-CM | POA: Diagnosis not present

## 2019-06-21 ENCOUNTER — Ambulatory Visit: Payer: No Typology Code available for payment source | Attending: Internal Medicine

## 2019-06-21 DIAGNOSIS — Z20822 Contact with and (suspected) exposure to covid-19: Secondary | ICD-10-CM

## 2019-06-22 LAB — NOVEL CORONAVIRUS, NAA: SARS-CoV-2, NAA: DETECTED — AB

## 2019-06-23 ENCOUNTER — Telehealth: Payer: Self-pay | Admitting: Nurse Practitioner

## 2019-06-23 NOTE — Telephone Encounter (Signed)
Called to Discuss with patient about Covid symptoms and the use of bamlanivimab, a monoclonal antibody infusion for those with mild to moderate Covid symptoms and at a high risk of hospitalization.     Pt is qualified for this infusion at the [facility] infusion center due to co-morbid conditions and/or a member of an at-risk group.     Patient Active Problem List   Diagnosis Date Noted  . Acute pain of left shoulder 06/27/2016  . Acute bursitis of left shoulder 06/27/2016    Patient states that symptoms started over 10 days ago. Symptoms tier reviewed as well as criteria for ending isolation. Preventative practices reviewed. Patient verbalized understanding.   Patient advised to go to Urgent care or ED with severe symptoms.

## 2019-09-09 ENCOUNTER — Ambulatory Visit: Payer: No Typology Code available for payment source | Attending: Internal Medicine

## 2019-09-09 DIAGNOSIS — Z23 Encounter for immunization: Secondary | ICD-10-CM

## 2019-09-09 NOTE — Progress Notes (Signed)
   Covid-19 Vaccination Clinic  Name:  Miranda Sweeney    MRN: [id number] DOB: 29-Mar-1961  09/09/2019  [name] was observed post Covid-19 immunization for 15 minutes without incident. She was provided with Vaccine Information Sheet and instruction to access the V-Safe system.   [name] was instructed to call 911 with any severe reactions post vaccine: Marland Kitchen Difficulty breathing  . Swelling of face and throat  . A fast heartbeat  . A bad rash all over body  . Dizziness and weakness   Immunizations Administered    Name Date Dose VIS Date Route   Moderna COVID-19 Vaccine 09/09/2019 11:23 AM 0.5 mL 05/17/2019 Intramuscular   Manufacturer: Moderna   Lot: [id number]   NDC: 20601-561-53

## 2019-10-12 ENCOUNTER — Ambulatory Visit: Payer: No Typology Code available for payment source | Attending: Internal Medicine

## 2019-10-12 DIAGNOSIS — Z23 Encounter for immunization: Secondary | ICD-10-CM

## 2019-10-12 NOTE — Progress Notes (Signed)
   Covid-19 Vaccination Clinic  Name:  Miranda Sweeney    MRN: [id number] DOB: 12-02-1960  10/12/2019  Miranda Sweeney was observed post Covid-19 immunization for 15 minutes without incident. She was provided with Vaccine Information Sheet and instruction to access the V-Safe system.   Miranda Sweeney was instructed to call 911 with any severe reactions post vaccine: Marland Kitchen Difficulty breathing  . Swelling of face and throat  . A fast heartbeat  . A bad rash all over body  . Dizziness and weakness   Immunizations Administered    Name Date Dose VIS Date Route   Moderna COVID-19 Vaccine 10/12/2019 10:06 AM 0.5 mL 05/2019 Intramuscular   Manufacturer: Moderna   Lot: [id number]   NDC: 14782-956-21

## 2020-09-26 IMAGING — MG MM DIGITAL SCREENING BILAT W/ TOMO W/ CAD
6 of 10 series · 6 of 30 positions shown · non-contrast
Comparison: Previous exam(s).

CLINICAL DATA: Screening.

EXAM:
DIGITAL SCREENING BILATERAL MAMMOGRAM WITH TOMO AND CAD

[R CC synth-2D (1 of 2)]
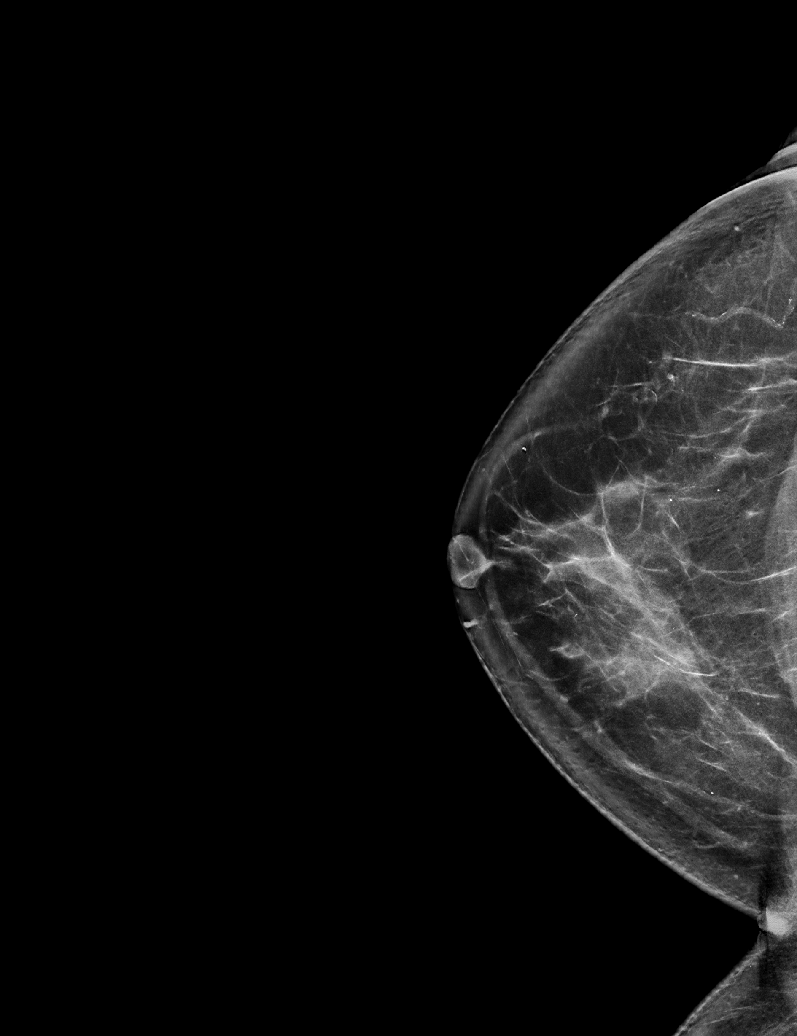

[R CC synth-2D (2 of 2)]
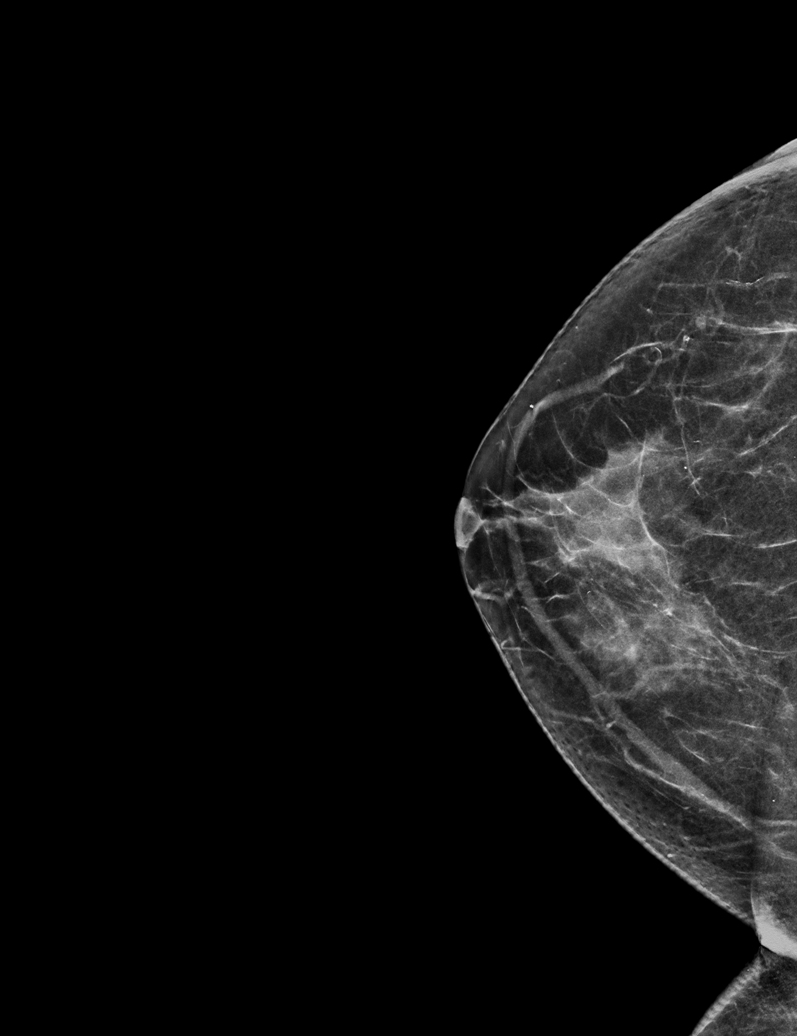

[R MLO synth-2D]
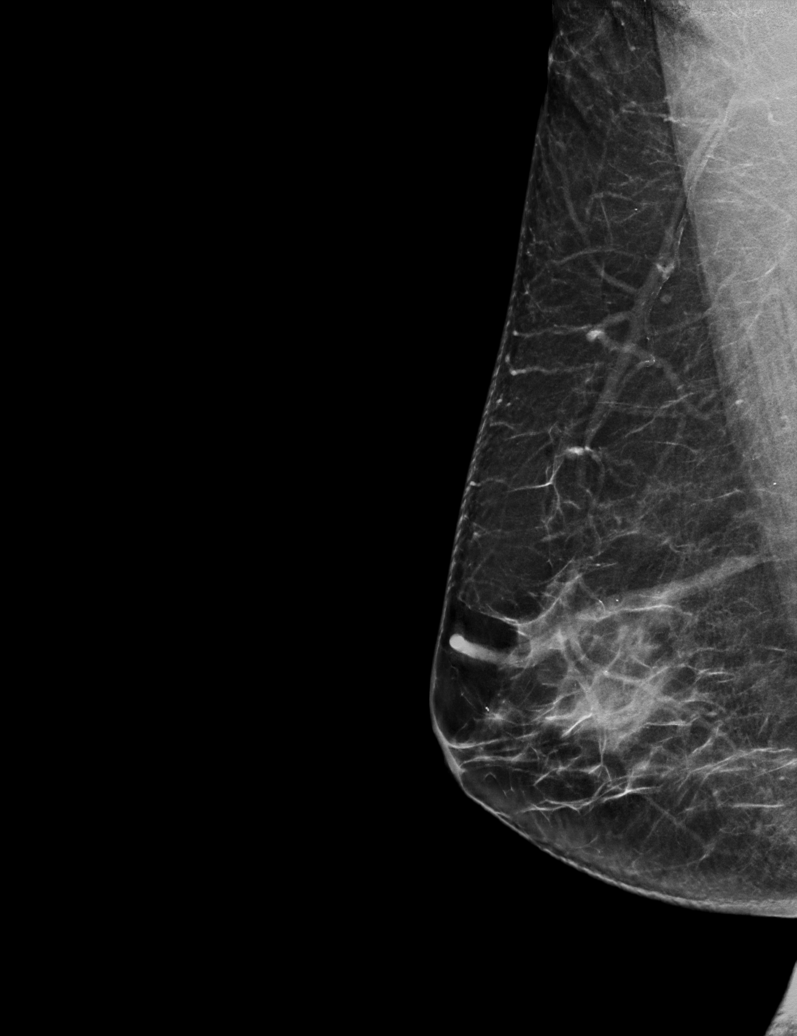

[L CC synth-2D]
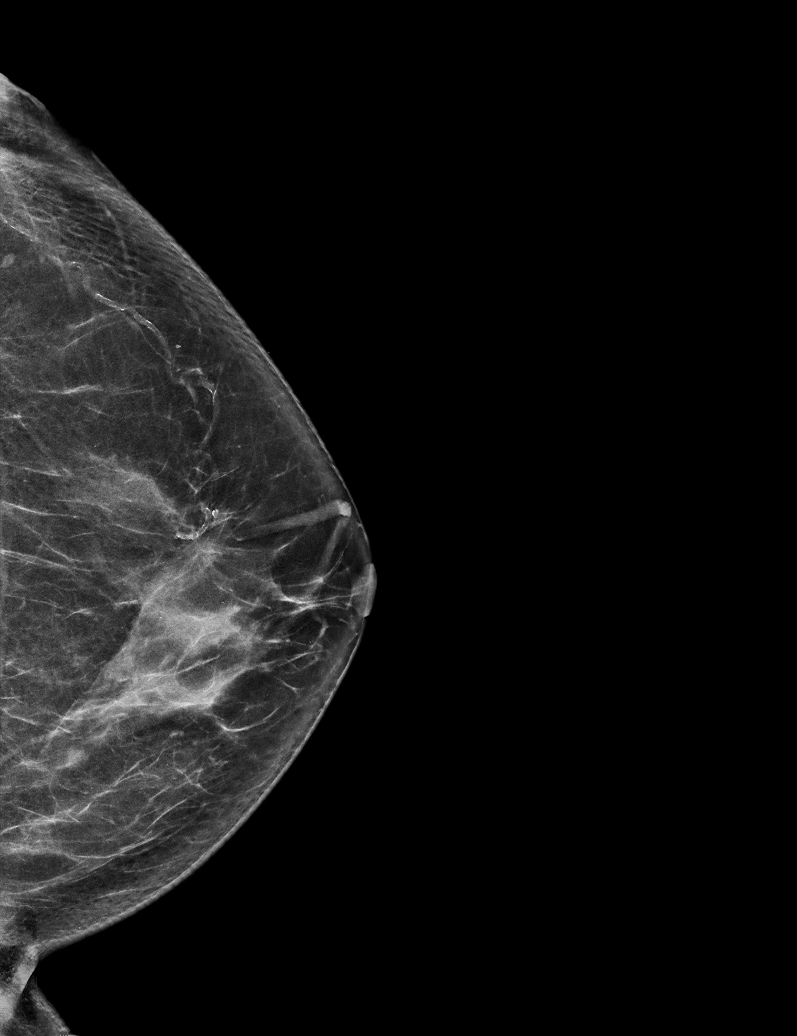

[L MLO synth-2D]
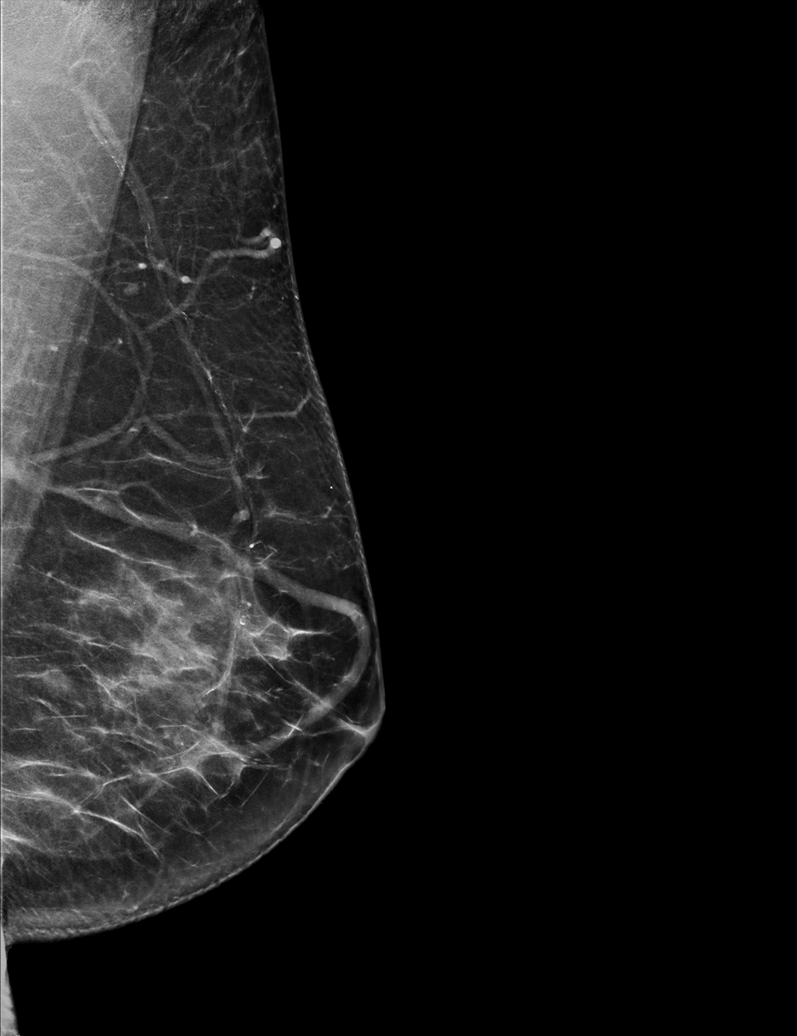

[L CC tomo · tomo slice 32/63.0]
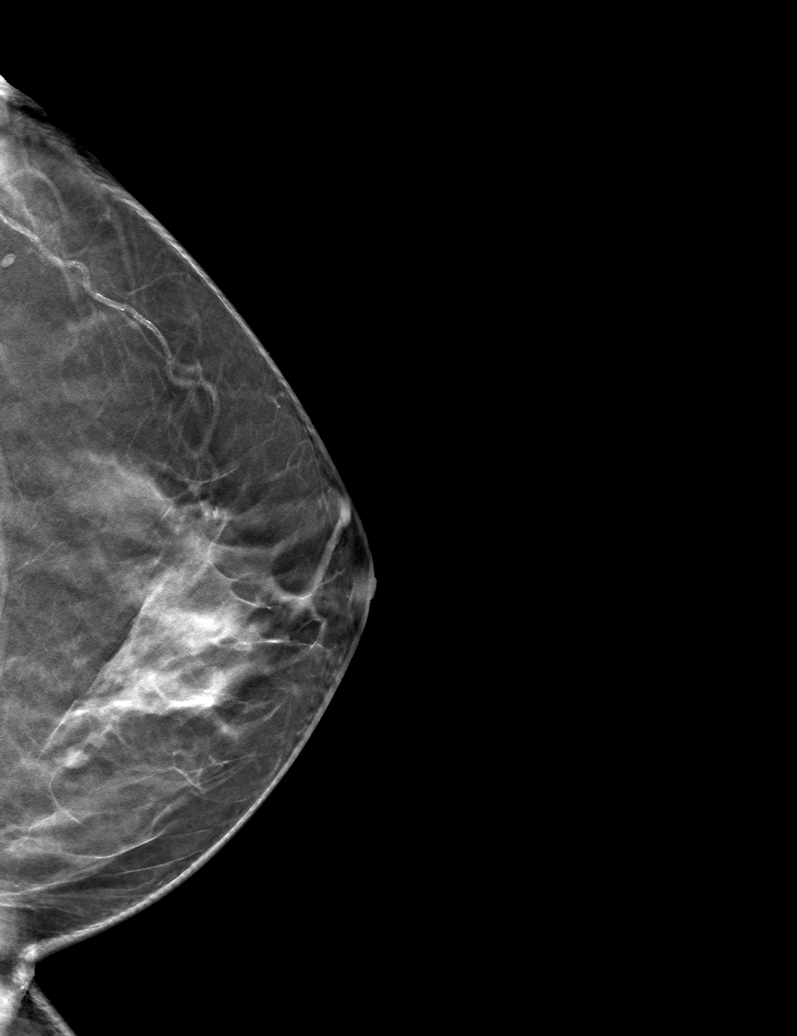

[6 of 30 positions shown; findings below may reference images not displayed]

ACR Breast Density Category c: The breast tissue is heterogeneously
dense, which may obscure small masses.
FINDINGS: There are no findings suspicious for malignancy. Images were
processed with CAD.
IMPRESSION: No mammographic evidence of malignancy. A result letter of this
screening mammogram will be mailed directly to the patient.

RECOMMENDATION:
Screening mammogram in one year. (Code:[id number]-[id number])

BI-RADS CATEGORY  1: Negative.

## 2021-03-25 ENCOUNTER — Other Ambulatory Visit: Payer: Self-pay

## 2021-03-25 ENCOUNTER — Other Ambulatory Visit: Payer: Self-pay | Admitting: Physician Assistant

## 2021-03-25 ENCOUNTER — Ambulatory Visit
Admission: RE | Admit: 2021-03-25 | Discharge: 2021-03-25 | Disposition: A | Discharge status: Home / Self Care | Payer: BC Managed Care – PPO | Source: Ambulatory Visit | Attending: Physician Assistant | Admitting: Physician Assistant

## 2021-03-25 DIAGNOSIS — Z1231 Encounter for screening mammogram for malignant neoplasm of breast: Secondary | ICD-10-CM

## 2023-01-06 ENCOUNTER — Encounter (HOSPITAL_COMMUNITY): Payer: Self-pay

## 2023-01-06 ENCOUNTER — Ambulatory Visit (HOSPITAL_COMMUNITY)
Admission: EM | Admit: 2023-01-06 | Discharge: 2023-01-06 | Disposition: A | Discharge status: Home / Self Care | Payer: BC Managed Care – PPO | Attending: Family Medicine | Admitting: Family Medicine

## 2023-01-06 ENCOUNTER — Ambulatory Visit (INDEPENDENT_AMBULATORY_CARE_PROVIDER_SITE_OTHER): Payer: BC Managed Care – PPO

## 2023-01-06 DIAGNOSIS — M25561 Pain in right knee: Secondary | ICD-10-CM | POA: Diagnosis not present

## 2023-01-06 MED ORDER — NAPROXEN 500 MG PO TABS: 500.0000 mg | ORAL_TABLET | Freq: Two times a day (BID) | ORAL | 0 refills | Status: AC | PRN

## 2023-01-06 NOTE — ED Triage Notes (Signed)
Patient reports that she has had intermittent right knee pain x 1 month, but worse in the past week.  Patient reports that she is "up and down" at her job a lot.  Patient states she has taken Tylenol for her pain, but no relief. Patient denies any medication today.

## 2023-01-06 NOTE — Discharge Instructions (Addendum)
Your x-ray shows some mild arthritis by my review.  The radiologist will read your x-rays also and give [place] a report.  He will notify you if they see anything significantly different from my reading.  Take naproxen 500 mg--1 tablet every 12 hours as needed for pain  You can use the QR code/website at the back of the summary paperwork to schedule yourself a new patient appointment with primary care

## 2023-01-06 NOTE — ED Provider Notes (Signed)
[facility]    CSN: [id number] Arrival date & time: 01/06/23  2952      History   Chief Complaint Chief Complaint  Patient presents with   Knee Pain    HPI Miranda Sweeney is a 63 y.o. female.    Knee Pain Here for right knee pain that is been bothering her for about a month, and then it has been worse in the last week.  No trauma but she does do some deep knee bending at her job.  No fever or rash  She is allergic to aspirin which causes itching.  It looks like she is tolerated naproxen in the past  Last kidney function done in epic was normal.  Past Medical History:  Diagnosis Date   Diabetes mellitus without complication [facility])     Patient Active Problem List   Diagnosis Date Noted   Acute pain of left shoulder 06/27/2016   Acute bursitis of left shoulder 06/27/2016    History reviewed. No pertinent surgical history.  OB History   No obstetric history on file.      Home Medications    Prior to Admission medications   Medication Sig Start Date End Date Taking? Authorizing Provider  naproxen (NAPROSYN) 500 MG tablet Take 1 tablet (500 mg total) by mouth 2 (two) times daily as needed (pain). 01/06/23  Yes Zenia Resides, MD  blood glucose meter kit and supplies KIT Dispense based on patient and insurance preference. Use up to four times daily as directed. (FOR ICD-9 250.00, 250.01). 11/17/15   Trena Platt D, PA  glucose blood test strip Use as instructed 01/04/16   Trena Platt D, PA  glucose blood test strip 1 each by Other route 4 (four) times daily as needed. Test glucose up to 4 (four) times per day. Dx: E11.65 01/04/16   Garnetta Buddy, PA    Family History Family History  Problem Relation Age of Onset   Breast cancer Neg Hx    Diabetes Neg Hx    Hypertension Neg Hx     Social History Social History   Tobacco Use   Smoking status: Never   Smokeless tobacco: Never  Vaping Use   Vaping status: Never Used  Substance  Use Topics   Alcohol use: Never   Drug use: Never     Allergies   Aspirin   Review of Systems Review of Systems   Physical Exam Triage Vital Signs ED Triage Vitals  Encounter Vitals Group     BP 01/06/23 1017 117/71     Systolic BP Percentile --      Diastolic BP Percentile --      Pulse Rate 01/06/23 1017 63     Resp 01/06/23 1017 14     Temp 01/06/23 1017 98 F (36.7 C)     Temp Source 01/06/23 1017 Oral     SpO2 01/06/23 1017 96 %     Weight --      Height --      Head Circumference --      Peak Flow --      Pain Score 01/06/23 1018 9     Pain Loc --      Pain Education --      Exclude from Growth Chart --    No data found.  Updated Vital Signs BP 117/71 (BP Location: Left Arm)   Pulse 63   Temp 98 F (36.7 C) (Oral)   Resp 14   SpO2 96%  Visual Acuity Right Eye Distance:   Left Eye Distance:   Bilateral Distance:    Right Eye Near:   Left Eye Near:    Bilateral Near:     Physical Exam Vitals reviewed.  Constitutional:      General: She is not in acute distress.    Appearance: She is not ill-appearing, toxic-appearing or diaphoretic.  Musculoskeletal:     Comments: There is no effusion of the right knee range of motion is normal.  The medial joint line is little tender.  Skin:    Coloration: Skin is not jaundiced or pale.  Neurological:     General: No focal deficit present.     Mental Status: She is alert and oriented to person, place, and time.  Psychiatric:        Behavior: Behavior normal.      UC Treatments / Results  Labs (all labs ordered are listed, but only abnormal results are displayed) Labs Reviewed - No data to display  EKG   Radiology No results found.  Procedures Procedures (including critical care time)  Medications Ordered in UC Medications - No data to display  Initial Impression / Assessment and Plan / UC Course  I have reviewed the triage vital signs and the nursing notes.  Pertinent labs & imaging  results that were available during my care of the patient were reviewed by me and considered in my medical decision making (see chart for details).        X-rays by my review show some mild arthritis changes.  Naproxen is sent to the pharmacy and knee brace is applied here.  She is given contact information for orthopedics and staff will help her set up an appointment with primary care Final Clinical Impressions(s) / UC Diagnoses   Final diagnoses:  Acute pain of right knee     Discharge Instructions      Your x-ray shows some mild arthritis by my review.  The radiologist will read your x-rays also and give [place] a report.  He will notify you if they see anything significantly different from my reading.  Take naproxen 500 mg--1 tablet every 12 hours as needed for pain  You can use the QR code/website at the back of the summary paperwork to schedule yourself a new patient appointment with primary care        ED Prescriptions     Medication Sig Dispense Auth. Provider   naproxen (NAPROSYN) 500 MG tablet Take 1 tablet (500 mg total) by mouth 2 (two) times daily as needed (pain). 30 tablet Nashon Erbes, Janace Aris, MD      PDMP not reviewed this encounter.   Zenia Resides, MD 01/06/23 1050

## 2023-01-14 ENCOUNTER — Ambulatory Visit (HOSPITAL_COMMUNITY)
Admission: EM | Admit: 2023-01-14 | Discharge: 2023-01-14 | Disposition: A | Discharge status: Home / Self Care | Payer: BC Managed Care – PPO | Attending: Emergency Medicine | Admitting: Emergency Medicine

## 2023-01-14 ENCOUNTER — Encounter (HOSPITAL_COMMUNITY): Payer: Self-pay

## 2023-01-14 DIAGNOSIS — M25561 Pain in right knee: Secondary | ICD-10-CM | POA: Diagnosis not present

## 2023-01-14 MED ORDER — KETOROLAC TROMETHAMINE 30 MG/ML IJ SOLN
30.0000 mg | Freq: Once | INTRAMUSCULAR | Status: AC
  Administered 2023-01-14: 30 mg via INTRAMUSCULAR

## 2023-01-14 MED ORDER — KETOROLAC TROMETHAMINE 30 MG/ML IJ SOLN
INTRAMUSCULAR | Status: AC
  Filled 2023-01-14: qty 1

## 2023-01-14 NOTE — ED Triage Notes (Signed)
Patient here today with c/o posterior right knee pain that has been going on for over a month. She came here last week. Patient started to feeling better with rest but once she went back to work she started having increased pain again. Patient states that yesterday it hurt so bad that she almost fell. Twisting hurts.

## 2023-01-14 NOTE — Discharge Instructions (Signed)
You can trial the Ace wrap for more compression.  We have given you a Toradol injection in clinic for pain and inflammation, you can restart your naproxen tomorrow.  Please rest, you can ice the back of your knee or heating, elevate your knee and avoid activities that trigger your knee pain.  Please follow-up with an orthopedic if your knee pain persist, I have attached information for one to your discharge papers.  Return to clinic for new or urgent symptoms.

## 2023-01-14 NOTE — ED Provider Notes (Signed)
[facility]    CSN: [id number] Arrival date & time: 01/14/23  1101      History   Chief Complaint Chief Complaint  Patient presents with   Knee Pain    HPI Miranda Sweeney is a 62 y.o. female.   Patient presents to clinic for continued right posterior knee pain.  Her pain was improving with the knee brace and the naproxen, but she recently went back to work and it seems to have aggravated her knee pain again.  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  Feels tight in her posterior knee, reports that it feels different than her left knee.  Denies any chest pain or shortness of breath.  Has been wearing the knee brace.   The history is provided by the patient and medical records.  Knee Pain Associated symptoms: no fever     Past Medical History:  Diagnosis Date   Diabetes mellitus without complication [facility])     Patient Active Problem List   Diagnosis Date Noted   Acute pain of left shoulder 06/27/2016   Acute bursitis of left shoulder 06/27/2016    History reviewed. No pertinent surgical history.  OB History   No obstetric history on file.      Home Medications    Prior to Admission medications   Medication Sig Start Date End Date Taking? Authorizing Provider  naproxen (NAPROSYN) 500 MG tablet Take 1 tablet (500 mg total) by mouth 2 (two) times daily as needed (pain). 01/06/23  Yes Zenia Resides, MD  blood glucose meter kit and supplies KIT Dispense based on patient and insurance preference. Use up to four times daily as directed. (FOR ICD-9 250.00, 250.01). 11/17/15   Trena Platt D, PA  glucose blood test strip Use as instructed 01/04/16   Trena Platt D, PA  glucose blood test strip 1 each by Other route 4 (four) times daily as needed. Test glucose up to 4 (four) times per day. Dx: E11.65 01/04/16   Garnetta Buddy, PA    Family History Family History  Problem  Relation Age of Onset   Breast cancer Neg Hx    Diabetes Neg Hx    Hypertension Neg Hx     Social History Social History   Tobacco Use   Smoking status: Never   Smokeless tobacco: Never  Vaping Use   Vaping status: Never Used  Substance Use Topics   Alcohol use: Never   Drug use: Never     Allergies   Aspirin   Review of Systems Review of Systems  Constitutional:  Negative for fever.  Respiratory:  Negative for cough.   Cardiovascular:  Negative for chest pain.  Musculoskeletal:  Positive for arthralgias.     Physical Exam Triage Vital Signs ED Triage Vitals  Encounter Vitals Group     BP 01/14/23 1212 (!) 155/70     Systolic BP Percentile --      Diastolic BP Percentile --      Pulse Rate 01/14/23 1212 66     Resp 01/14/23 1212 16     Temp 01/14/23 1212 98.2 F (36.8 C)     Temp Source 01/14/23 1212 Oral     SpO2 01/14/23 1212 96 %     Weight 01/14/23 1211 139 lb (63 kg)     Height 01/14/23 1211 5' (1.524 m)     Head Circumference --  Peak Flow --      Pain Score 01/14/23 1211 10     Pain Loc --      Pain Education --      Exclude from Growth Chart --    No data found.  Updated Vital Signs BP (!) 155/70 (BP Location: Left Arm)   Pulse 66   Temp 98.2 F (36.8 C) (Oral)   Resp 16   Ht 5' (1.524 m)   Wt 139 lb (63 kg)   SpO2 96%   BMI 27.15 kg/m   Visual Acuity Right Eye Distance:   Left Eye Distance:   Bilateral Distance:    Right Eye Near:   Left Eye Near:    Bilateral Near:     Physical Exam Vitals and nursing note reviewed.  Constitutional:      Appearance: Normal appearance.  HENT:     Head: Normocephalic and atraumatic.     Right Ear: External ear normal.     Left Ear: External ear normal.     Nose: Nose normal.     Mouth/Throat:     Mouth: Mucous membranes are moist.  Eyes:     Conjunctiva/sclera: Conjunctivae normal.  Cardiovascular:     Rate and Rhythm: Normal rate.  Pulmonary:     Effort: Pulmonary effort is  normal. No respiratory distress.  Musculoskeletal:        General: No swelling, tenderness, deformity or signs of injury. Normal range of motion.  Skin:    General: Skin is warm and dry.  Neurological:     General: No focal deficit present.     Mental Status: She is alert and oriented to person, place, and time.  Psychiatric:        Mood and Affect: Mood normal.        Behavior: Behavior normal.      UC Treatments / Results  Labs (all labs ordered are listed, but only abnormal results are displayed) Labs Reviewed - No data to display  EKG   Radiology No results found.  Procedures Procedures (including critical care time)  Medications Ordered in UC Medications  ketorolac (TORADOL) 30 MG/ML injection 30 mg (30 mg Intramuscular Given 01/14/23 1255)    Initial Impression / Assessment and Plan / UC Course  I have reviewed the triage vital signs and the nursing notes.  Pertinent labs & imaging results that were available during my care of the patient were reviewed by me and considered in my medical decision making (see chart for details).  Vitals in triage reviewed, patient is hemodynamically stable.  Initially right posterior knee pain was improving after rest, anti-inflammatories and the knee brace, working seems to have reaggravated her injury.  Imaging at previous visit was negative.  Suspect potential popliteal cyst vs arthritis.  Advised orthopedic follow-up if symptoms persist.  Given IM Toradol in clinic for pain, is tolerating naproxen well, allergy to aspirin his rash.  Plan of care, follow-up care and return precautions given, no questions at this time.     Final Clinical Impressions(s) / UC Diagnoses   Final diagnoses:  Acute pain of right knee     Discharge Instructions      You can trial the Ace wrap for more compression.  We have given you a Toradol injection in clinic for pain and inflammation, you can restart your naproxen tomorrow.  Please rest, you can  ice the back of your knee or heating, elevate your knee and avoid activities that trigger your knee  pain.  Please follow-up with an orthopedic if your knee pain persist, I have attached information for one to your discharge papers.  Return to clinic for new or urgent symptoms.       ED Prescriptions   None    PDMP not reviewed this encounter.   Delmer Kowalski, [place] N, [place] 01/14/23 1258

## 2024-05-26 ENCOUNTER — Other Ambulatory Visit: Payer: Self-pay | Admitting: Family Medicine

## 2024-05-26 DIAGNOSIS — Z1231 Encounter for screening mammogram for malignant neoplasm of breast: Secondary | ICD-10-CM
